# Patient Record
Sex: Female | Born: 1995 | Race: White | Hispanic: No | Marital: Single | Smoking: Former smoker
Health system: Southern US, Community
[De-identification: ages and names within clinical notes are randomized; demographics above are authoritative.]

## PROBLEM LIST (undated history)

## (undated) ENCOUNTER — Inpatient Hospital Stay (HOSPITAL_COMMUNITY): Payer: Self-pay

## (undated) DIAGNOSIS — F17200 Nicotine dependence, unspecified, uncomplicated: Secondary | ICD-10-CM

## (undated) DIAGNOSIS — K08409 Partial loss of teeth, unspecified cause, unspecified class: Secondary | ICD-10-CM

## (undated) DIAGNOSIS — G43909 Migraine, unspecified, not intractable, without status migrainosus: Secondary | ICD-10-CM

## (undated) DIAGNOSIS — N390 Urinary tract infection, site not specified: Secondary | ICD-10-CM

## (undated) DIAGNOSIS — F419 Anxiety disorder, unspecified: Secondary | ICD-10-CM

## (undated) HISTORY — PX: NO PAST SURGERIES: SHX2092

## (undated) HISTORY — PX: WISDOM TOOTH EXTRACTION: SHX21

---
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
## 2017-03-06 ENCOUNTER — Ambulatory Visit (HOSPITAL_COMMUNITY)
Admission: EM | Admit: 2017-03-06 | Discharge: 2017-03-06 | Disposition: A | Discharge status: Home / Self Care | Payer: Self-pay | Attending: Family Medicine | Admitting: Family Medicine

## 2017-03-06 ENCOUNTER — Encounter (HOSPITAL_COMMUNITY): Payer: Self-pay | Admitting: Emergency Medicine

## 2017-03-06 DIAGNOSIS — K2900 Acute gastritis without bleeding: Secondary | ICD-10-CM

## 2017-03-06 DIAGNOSIS — R109 Unspecified abdominal pain: Secondary | ICD-10-CM

## 2017-03-06 DIAGNOSIS — R110 Nausea: Secondary | ICD-10-CM

## 2017-03-06 DIAGNOSIS — Z3202 Encounter for pregnancy test, result negative: Secondary | ICD-10-CM

## 2017-03-06 LAB — POCT URINALYSIS DIP (DEVICE)
BILIRUBIN URINE: NEGATIVE
Bilirubin Urine: NEGATIVE
Glucose, UA: NEGATIVE mg/dL
Glucose, UA: NEGATIVE mg/dL
HGB URINE DIPSTICK: NEGATIVE
HGB URINE DIPSTICK: NEGATIVE
Ketones, ur: NEGATIVE mg/dL
Ketones, ur: NEGATIVE mg/dL
LEUKOCYTES UA: NEGATIVE
NITRITE: NEGATIVE
NITRITE: NEGATIVE
PROTEIN: NEGATIVE mg/dL
PROTEIN: NEGATIVE mg/dL
Specific Gravity, Urine: 1.015 (ref 1.005–1.030)
Specific Gravity, Urine: 1.015 (ref 1.005–1.030)
UROBILINOGEN UA: 0.2 mg/dL (ref 0.0–1.0)
UROBILINOGEN UA: 0.2 mg/dL (ref 0.0–1.0)
pH: 8.5 — ABNORMAL HIGH (ref 5.0–8.0)
pH: >=9 (ref 5.0–8.0)

## 2017-03-06 LAB — POCT PREGNANCY, URINE: Preg Test, Ur: NEGATIVE

## 2017-03-06 MED ORDER — RANITIDINE HCL 150 MG PO TABS: 150.0000 mg | ORAL_TABLET | Freq: Two times a day (BID) | ORAL | 0 refills | Status: DC

## 2017-03-06 NOTE — ED Triage Notes (Signed)
PT reports nausea for a few days. PT reports sharp in in mid abdomen. PT reports she has vomited in the past 24 hours.

## 2017-03-06 NOTE — ED Provider Notes (Signed)
[facility]   [id number] 03/06/17 Arrival Time: 1223   SUBJECTIVE:  Angela Cunningham is a 22 y.o. female who presents to the urgent care with complaint of nausea for a few days. PT reports sharp mid abdomen pain in the lobby.   Starting Friday she lost her appetite and food around her made her nauseated.  She vomited multiple times began about 1:00 this morning.  Sexually active.  LMP:  Early this month  Past Medical History:  Diagnosis Date  . Migraine    No family history on file. Social History   Socioeconomic History  . Marital status: Married    Spouse name: Not on file  . Number of children: Not on file  . Years of education: Not on file  . Highest education level: Not on file  Social Needs  . Financial resource strain: Not on file  . Food insecurity - worry: Not on file  . Food insecurity - inability: Not on file  . Transportation needs - medical: Not on file  . Transportation needs - non-medical: Not on file  Occupational History  . Not on file  Tobacco Use  . Smoking status: Never Smoker  . Smokeless tobacco: Never Used  Substance and Sexual Activity  . Alcohol use: No  . Drug use: No  . Sexual activity: Yes    Birth control/protection: Injection  Other Topics Concern  . Not on file  Social History Narrative  . Not on file   No outpatient medications have been marked as taking for the 03/06/17 encounter [facility] Encounter).   No Known Allergies    ROS: As per HPI, remainder of ROS negative.   OBJECTIVE:   Vitals:   03/06/17 1329 03/06/17 1330  BP: 113/79   Pulse: 67   Resp: 18   Temp: 98.4 F (36.9 C)   TempSrc: Oral   SpO2: 100%   Weight:  130 lb (59 kg)  Height:  5\' 2"  (1.575 m)     General appearance: alert; no distress Eyes: PERRL; EOMI; conjunctiva normal HENT: normocephalic; atraumatic;oral mucosa normal Neck: supple Lungs: clear to auscultation bilaterally Heart: regular rate and rhythm Abdomen: soft,  non-tender; bowel sounds normal; no masses or organomegaly; no guarding or rebound tenderness Back: no CVA tenderness Extremities: no cyanosis or edema; symmetrical with no gross deformities Skin: warm and dry Neurologic: normal gait; grossly normal Psychological: alert and cooperative; normal mood and affect      Labs:  Results for orders placed or performed during the hospital encounter of 03/06/17  POCT urinalysis dip (device)  Result Value Ref Range   Glucose, UA NEGATIVE NEGATIVE mg/dL   Bilirubin Urine NEGATIVE NEGATIVE   Ketones, ur NEGATIVE NEGATIVE mg/dL   Specific Gravity, Urine 1.015 1.005 - 1.030   Hgb urine dipstick NEGATIVE NEGATIVE   pH 8.5 (H) 5.0 - 8.0   Protein, ur NEGATIVE NEGATIVE mg/dL   Urobilinogen, UA 0.2 0.0 - 1.0 mg/dL   Nitrite NEGATIVE NEGATIVE   Leukocytes, UA TRACE (A) NEGATIVE  Pregnancy, urine POC  Result Value Ref Range   Preg Test, Ur NEGATIVE NEGATIVE    Labs Reviewed  POCT URINALYSIS DIP (DEVICE) - Abnormal; Notable for the following components:      Result Value   pH 8.5 (*)    Leukocytes, UA TRACE (*)    All other components within normal limits  POCT PREGNANCY, URINE    No results found.     ASSESSMENT & PLAN:  No diagnosis found.  No orders of the defined types were placed in this encounter.   Reviewed expectations re: course of current medical issues. Questions answered. Outlined signs and symptoms indicating need for more acute intervention. Patient verbalized understanding. After Visit Summary given.    Procedures:      Elvina SidleLauenstein, Eulises Kijowski, MD 03/06/17 1423

## 2017-03-06 NOTE — Discharge Instructions (Signed)
Your tests are negative.  Symptoms are most consistent with gastritis at this point.  Expect resolution in 24 hours.

## 2017-04-04 ENCOUNTER — Encounter (HOSPITAL_COMMUNITY): Payer: Self-pay | Admitting: Family Medicine

## 2017-04-04 DIAGNOSIS — Z5321 Procedure and treatment not carried out due to patient leaving prior to being seen by health care provider: Secondary | ICD-10-CM | POA: Insufficient documentation

## 2017-04-04 DIAGNOSIS — M545 Low back pain: Secondary | ICD-10-CM | POA: Insufficient documentation

## 2017-04-04 LAB — URINALYSIS, ROUTINE W REFLEX MICROSCOPIC
Bilirubin Urine: NEGATIVE
GLUCOSE, UA: NEGATIVE mg/dL
Ketones, ur: 5 mg/dL — AB
NITRITE: NEGATIVE
PH: 5 (ref 5.0–8.0)
PROTEIN: NEGATIVE mg/dL
Specific Gravity, Urine: 1.029 (ref 1.005–1.030)

## 2017-04-04 NOTE — ED Triage Notes (Signed)
Patient reports she experiencing is lower back pain, burning with urination, and increase urgency/frequency These symptoms started about 2 weeks, she took an old antibiotic from a previous infection. Symptoms started back 3 days ago. Also, complains of fever, nasal congestion/drainage, and sore throat. These symptoms have been occurring since Friday.

## 2017-04-04 NOTE — ED Notes (Signed)
There was not enough urine for a POC and UA. Sent down for a UA and could possibly do Beta for pregnancy if pt is not able to urinate again.

## 2017-04-04 NOTE — ED Notes (Signed)
Pt called for VS recheck with no response.  RN notified. 

## 2017-04-05 ENCOUNTER — Emergency Department (HOSPITAL_COMMUNITY)
Admission: EM | Admit: 2017-04-05 | Discharge: 2017-04-05 | Disposition: A | Discharge status: Home / Self Care | Payer: Self-pay | Attending: Emergency Medicine | Admitting: Emergency Medicine

## 2017-04-05 ENCOUNTER — Encounter (HOSPITAL_COMMUNITY): Payer: Self-pay | Admitting: Emergency Medicine

## 2017-04-05 DIAGNOSIS — N10 Acute tubulo-interstitial nephritis: Secondary | ICD-10-CM | POA: Insufficient documentation

## 2017-04-05 DIAGNOSIS — F17200 Nicotine dependence, unspecified, uncomplicated: Secondary | ICD-10-CM | POA: Insufficient documentation

## 2017-04-05 DIAGNOSIS — B349 Viral infection, unspecified: Secondary | ICD-10-CM | POA: Insufficient documentation

## 2017-04-05 HISTORY — DX: Urinary tract infection, site not specified: N39.0

## 2017-04-05 LAB — URINALYSIS, ROUTINE W REFLEX MICROSCOPIC
BILIRUBIN URINE: NEGATIVE
GLUCOSE, UA: NEGATIVE mg/dL
HGB URINE DIPSTICK: NEGATIVE
KETONES UR: 5 mg/dL — AB
NITRITE: NEGATIVE
PH: 5 (ref 5.0–8.0)
Protein, ur: NEGATIVE mg/dL
Specific Gravity, Urine: 1.026 (ref 1.005–1.030)

## 2017-04-05 LAB — RAPID STREP SCREEN (MED CTR MEBANE ONLY): STREPTOCOCCUS, GROUP A SCREEN (DIRECT): NEGATIVE

## 2017-04-05 LAB — POC URINE PREG, ED: Preg Test, Ur: NEGATIVE

## 2017-04-05 MED ORDER — ONDANSETRON 4 MG PO TBDP: 4.0000 mg | ORAL_TABLET | Freq: Three times a day (TID) | ORAL | 0 refills | Status: DC | PRN

## 2017-04-05 MED ORDER — CEPHALEXIN 500 MG PO CAPS
500.0000 mg | ORAL_CAPSULE | Freq: Four times a day (QID) | ORAL | 0 refills | Status: DC
Start: 2017-04-05 — End: 2017-08-20

## 2017-04-05 NOTE — Discharge Instructions (Signed)
Please take the antibiotics as prescribed. Please return to the ER if your symptoms worsen; you have increased pain, fevers, chills, inability to keep any medications down, confusion. Otherwise see the outpatient doctor as requested.

## 2017-04-05 NOTE — ED Provider Notes (Signed)
[facility]-EMERGENCY DEPT Provider Note   CSN: [id number] Arrival date & time: 04/05/17  [id number]     History   Chief Complaint Chief Complaint  Patient presents with  . Nasal Congestion  . Dysuria    HPI Cyndia SkeetersHannah M Broden is a 22 y.o. female.  HPI  22 year old female comes in with chief complaint of pain with urination and right-sided flank pain. Patient has history of recurrent UTI, and reports that her symptoms are consistent with her prior UTIs.  Patient states that her symptoms started 2 weeks ago, she took Azo and leftover antibiotics from prior UTI.  Patient started getting better, but over the past few days her symptoms have returned and now she has flank pain.  Patient has associated subjective fevers and chills.  Patient denies any nausea or emesis.  Additionally, patient is also having congestion, sore throat, and malaise over the past few days.  Patient states that she has had sick contacts, and that her symptoms started about 4 days ago.  Past Medical History:  Diagnosis Date  . Migraine   . UTI (urinary tract infection)     There are no active problems to display for this patient.   History reviewed. No pertinent surgical history.  OB History    No data available       Home Medications    Prior to Admission medications   Medication Sig Start Date End Date Taking? Authorizing Provider  pseudoephedrine-acetaminophen (TYLENOL SINUS) 30-500 MG TABS tablet Take 1 tablet by mouth every 4 (four) hours as needed (congestion).   Yes [provider]  cephALEXin (KEFLEX) 500 MG capsule Take 1 capsule (500 mg total) by mouth 4 (four) times daily. 04/05/17   Derwood KaplanNanavati, Nyomi Howser, MD  ondansetron (ZOFRAN ODT) 4 MG disintegrating tablet Take 1 tablet (4 mg total) by mouth every 8 (eight) hours as needed for nausea or vomiting. 04/05/17   Derwood KaplanNanavati, Alissah Redmon, MD  ranitidine (ZANTAC) 150 MG tablet Take 1 tablet (150 mg total) by mouth 2 (two) times  daily. Patient not taking: Reported on 04/05/2017 03/06/17   Elvina SidleLauenstein, Kurt, MD    Family History History reviewed. No pertinent family history.  Social History Social History   Tobacco Use  . Smoking status: Current Every Day Smoker    Packs/day: 0.50  . Smokeless tobacco: Never Used  Substance Use Topics  . Alcohol use: No  . Drug use: No     Allergies   Patient has no known allergies.   Review of Systems Review of Systems  HENT: Positive for sore throat.   Respiratory: Positive for cough.   Gastrointestinal: Negative for abdominal pain, nausea and vomiting.  Genitourinary: Positive for dysuria and flank pain. Negative for vaginal bleeding and vaginal discharge.  Allergic/Immunologic: Negative for immunocompromised state.  All other systems reviewed and are negative.    Physical Exam Updated Vital Signs BP 126/82   Pulse 85   Temp 97.9 F (36.6 C) (Oral)   Resp 16   LMP 03/16/2017   SpO2 98%   Physical Exam  Constitutional: She is oriented to person, place, and time. She appears well-developed.  HENT:  Head: Normocephalic and atraumatic.  Eyes: EOM are normal.  Neck: Normal range of motion. Neck supple.  Cardiovascular: Normal rate.  Pulmonary/Chest: Effort normal. She has no wheezes.  Abdominal: Bowel sounds are normal. She exhibits no distension. There is no tenderness. There is no guarding.  Genitourinary:  Genitourinary Comments: Right-sided flank tenderness  Lymphadenopathy:  She has cervical adenopathy.  Neurological: She is alert and oriented to person, place, and time.  Skin: Skin is warm and dry.  Nursing note and vitals reviewed.    ED Treatments / Results  Labs (all labs ordered are listed, but only abnormal results are displayed) Labs Reviewed  URINALYSIS, ROUTINE W REFLEX MICROSCOPIC - Abnormal; Notable for the following components:      Result Value   APPearance CLOUDY (*)    Ketones, ur 5 (*)    Leukocytes, UA MODERATE (*)     Bacteria, UA RARE (*)    Squamous Epithelial / LPF 6-30 (*)    All other components within normal limits  RAPID STREP SCREEN (NOT AT [facility])  CULTURE, GROUP A STREP ([facility])  POC URINE PREG, ED    EKG  EKG Interpretation None       Radiology No results found.  Procedures Procedures (including critical care time)  Medications Ordered in ED Medications - No data to display   Initial Impression / Assessment and Plan / ED Course  I have reviewed the triage vital signs and the nursing notes.  Pertinent labs & imaging results that were available during my care of the patient were reviewed by me and considered in my medical decision making (see chart for details).     22 year old with history of recurrent UTIs comes in with chief complaint of dysuria and right-sided flank pain.  Differential diagnosis included kidney stone, appendicitis, however based on symptoms and previous history of recurrent UTI, it appears that most likely she is having pyelonephritis.  Patient was in the ER yesterday and her UA had shown multiple WBCs, and the repeat UA although contaminated shows similar findings.  Differential diagnoses also included pelvic infections, however patient denies any risk factors for, and states that her symptoms are from UTI that she has had in the past, which is a strong odds ratio of confirming UTI.  Additionally, patient is having flulike symptoms.  Patient had a sore throat with a rapid strep that is negative.  We will treat patient as a pyelonephritis. Strict ER return precautions have been discussed, and patient is agreeing with the plan and is comfortable with the workup done and the recommendations from the ER.   Final Clinical Impressions(s) / ED Diagnoses   Final diagnoses:  Acute viral syndrome  Acute pyelonephritis    ED Discharge Orders        Ordered    cephALEXin (KEFLEX) 500 MG capsule  4 times daily     04/05/17 1036    ondansetron (ZOFRAN ODT) 4 MG  disintegrating tablet  Every 8 hours PRN     04/05/17 1036       Derwood Kaplan, MD 04/05/17 1044

## 2017-04-05 NOTE — ED Triage Notes (Signed)
Pt reports sore throat and nasal congestion for the past 3-4 days. No cough. Pt also reports dysuria and R flank pain for the past 2 weeks. Tried Azo, but symptoms got worse.

## 2017-04-07 LAB — CULTURE, GROUP A STREP (THRC)

## 2017-08-20 ENCOUNTER — Encounter (HOSPITAL_COMMUNITY): Payer: Self-pay | Admitting: Emergency Medicine

## 2017-08-20 ENCOUNTER — Ambulatory Visit (HOSPITAL_COMMUNITY)
Admission: EM | Admit: 2017-08-20 | Discharge: 2017-08-20 | Disposition: A | Discharge status: Home / Self Care | Payer: Self-pay | Attending: Internal Medicine | Admitting: Internal Medicine

## 2017-08-20 DIAGNOSIS — Z3201 Encounter for pregnancy test, result positive: Secondary | ICD-10-CM

## 2017-08-20 DIAGNOSIS — R110 Nausea: Secondary | ICD-10-CM

## 2017-08-20 DIAGNOSIS — N925 Other specified irregular menstruation: Secondary | ICD-10-CM

## 2017-08-20 LAB — POCT PREGNANCY, URINE: Preg Test, Ur: POSITIVE — AB

## 2017-08-20 MED ORDER — PRENATAL COMPLETE 14-0.4 MG PO TABS: 1.0000 | ORAL_TABLET | Freq: Every day | ORAL | 0 refills | Status: AC

## 2017-08-20 MED ORDER — DOXYLAMINE-PYRIDOXINE 10-10 MG PO TBEC: 1.0000 | DELAYED_RELEASE_TABLET | Freq: Every evening | ORAL | 0 refills | Status: DC | PRN

## 2017-08-20 NOTE — ED Triage Notes (Signed)
Pt took a pregnancy test and it was positive and she "wants to be sure". Also c/o nausea.

## 2017-08-20 NOTE — ED Provider Notes (Signed)
[facility]    CSN: [id number] Arrival date & time: 08/20/17  1225     History   Chief Complaint Chief Complaint  Patient presents with  . Possible Pregnancy    HPI Angela Cunningham is a 22 y.o. female.   Angela Cunningham presents with requests for confirmation of pregnancy. States she missed her period, her last period was approximately June 2nd or 3rd, but states it was light. Not on birth control. No previous pregnancy. Does not have a PCP or gynecologist. Over the past few days has had nausea and occasional vomiting. No abdominal pain. Does not take any medications regularly. No bleeding. Took a pregnancy test last night and it was positive.     ROS per HPI.      Past Medical History:  Diagnosis Date  . Migraine   . UTI (urinary tract infection)     There are no active problems to display for this patient.   History reviewed. No pertinent surgical history.  OB History   None      Home Medications    Prior to Admission medications   Medication Sig Start Date End Date Taking? Authorizing Provider  Doxylamine-Pyridoxine 10-10 MG TBEC Take 1 tablet by mouth at bedtime as needed (nausea). 08/20/17   Georgetta HaberBurky, Natalie B, NP  Prenatal Vit-Fe Fumarate-FA (PRENATAL COMPLETE) 14-0.4 MG TABS Take 1 tablet by mouth daily. 08/20/17   Georgetta HaberBurky, Natalie B, NP    Family History No family history on file.  Social History Social History   Tobacco Use  . Smoking status: Current Every Day Smoker    Packs/day: 0.50  . Smokeless tobacco: Never Used  Substance Use Topics  . Alcohol use: No  . Drug use: No     Allergies   Patient has no known allergies.   Review of Systems Review of Systems   Physical Exam Triage Vital Signs ED Triage Vitals [08/20/17 1346]  Enc Vitals Group     BP 110/68     Pulse Rate 81     Resp 18     Temp 98.1 F (36.7 C)     Temp src      SpO2 100 %     Weight      Height      Head Circumference      Peak Flow      Pain Score 0       Pain Loc      Pain Edu?      Excl. in GC?    No data found.  Updated Vital Signs BP 110/68   Pulse 81   Temp 98.1 F (36.7 C)   Resp 18   SpO2 100%    Physical Exam  Constitutional: She is oriented to person, place, and time. She appears well-developed and well-nourished. No distress.  Cardiovascular: Normal rate, regular rhythm and normal heart sounds.  Pulmonary/Chest: Effort normal and breath sounds normal.  Abdominal: Soft. There is no tenderness.  Neurological: She is alert and oriented to person, place, and time.  Skin: Skin is warm and dry.     UC Treatments / Results  Labs (all labs ordered are listed, but only abnormal results are displayed) Labs Reviewed  POCT PREGNANCY, URINE - Abnormal; Notable for the following components:      Result Value   Preg Test, Ur POSITIVE (*)    All other components within normal limits    EKG None  Radiology No results found.  Procedures Procedures (including  critical care time)  Medications Ordered in UC Medications - No data to display  Initial Impression / Assessment and Plan / UC Course  I have reviewed the triage vital signs and the nursing notes.  Pertinent labs & imaging results that were available during my care of the patient were reviewed by me and considered in my medical decision making (see chart for details).     Positive urine pregnancy in clinic today. Approximately 73w6days pregnancy based on period 1 month ago. It was light however. Recommended follow up with OB for first appointment and schedule dating [place]. Quit smoking. Daily prenatal. meds safe during pregnancy list provided. Return precautions provided. Patient verbalized understanding and agreeable to plan.    Final Clinical Impressions(s) / UC Diagnoses   Final diagnoses:  Positive pregnancy test     Discharge Instructions     Daily prenatal vitamin.  Tylenol as needed for pain.  Avoid alcohol or smoking.  Please call to establish  with an Ob.  See list of medications safe during pregnancy as needed. Go to [facility] hospital or er if develop pain, bleeding, dehydration or otherwise concerned.    ED Prescriptions    Medication Sig Dispense Auth. Provider   Doxylamine-Pyridoxine 10-10 MG TBEC Take 1 tablet by mouth at bedtime as needed (nausea). 30 tablet Linus Mako B, NP   Prenatal Vit-Fe Fumarate-FA (PRENATAL COMPLETE) 14-0.4 MG TABS Take 1 tablet by mouth daily. 30 each Georgetta Haber, NP     Controlled Substance Prescriptions [place] Controlled Substance Registry consulted? Not Applicable   Georgetta Haber, NP 08/20/17 1502

## 2017-08-20 NOTE — Discharge Instructions (Signed)
Daily prenatal vitamin.  Tylenol as needed for pain.  Avoid alcohol or smoking.  Please call to establish with an Ob.  See list of medications safe during pregnancy as needed. [facility] if develop pain, bleeding, dehydration or otherwise concerned.

## 2017-08-22 ENCOUNTER — Inpatient Hospital Stay (HOSPITAL_COMMUNITY)
Admission: AD | Admit: 2017-08-22 | Discharge: 2017-08-22 | Disposition: A | Discharge status: Home / Self Care | Payer: Medicaid Other | Source: Ambulatory Visit | Attending: Obstetrics & Gynecology | Admitting: Obstetrics & Gynecology

## 2017-08-22 ENCOUNTER — Inpatient Hospital Stay (HOSPITAL_COMMUNITY): Payer: Medicaid Other

## 2017-08-22 ENCOUNTER — Encounter (HOSPITAL_COMMUNITY): Payer: Self-pay

## 2017-08-22 DIAGNOSIS — Z3A01 Less than 8 weeks gestation of pregnancy: Secondary | ICD-10-CM | POA: Insufficient documentation

## 2017-08-22 DIAGNOSIS — R1030 Lower abdominal pain, unspecified: Secondary | ICD-10-CM | POA: Insufficient documentation

## 2017-08-22 DIAGNOSIS — O26891 Other specified pregnancy related conditions, first trimester: Secondary | ICD-10-CM | POA: Diagnosis not present

## 2017-08-22 DIAGNOSIS — F17210 Nicotine dependence, cigarettes, uncomplicated: Secondary | ICD-10-CM | POA: Diagnosis not present

## 2017-08-22 DIAGNOSIS — Z79899 Other long term (current) drug therapy: Secondary | ICD-10-CM | POA: Insufficient documentation

## 2017-08-22 DIAGNOSIS — R109 Unspecified abdominal pain: Secondary | ICD-10-CM

## 2017-08-22 DIAGNOSIS — Z3491 Encounter for supervision of normal pregnancy, unspecified, first trimester: Secondary | ICD-10-CM

## 2017-08-22 DIAGNOSIS — O99331 Smoking (tobacco) complicating pregnancy, first trimester: Secondary | ICD-10-CM | POA: Insufficient documentation

## 2017-08-22 DIAGNOSIS — O219 Vomiting of pregnancy, unspecified: Secondary | ICD-10-CM | POA: Diagnosis not present

## 2017-08-22 LAB — CBC
HEMATOCRIT: 36.8 % (ref 36.0–46.0)
HEMOGLOBIN: 12.5 g/dL (ref 12.0–15.0)
MCH: 31.3 pg (ref 26.0–34.0)
MCHC: 34 g/dL (ref 30.0–36.0)
MCV: 92.2 fL (ref 78.0–100.0)
Platelets: 204 10*3/uL (ref 150–400)
RBC: 3.99 MIL/uL (ref 3.87–5.11)
RDW: 13.5 % (ref 11.5–15.5)
WBC: 9.5 10*3/uL (ref 4.0–10.5)

## 2017-08-22 LAB — GC/CHLAMYDIA PROBE AMP (~~LOC~~) NOT AT ARMC
Chlamydia: NEGATIVE
Neisseria Gonorrhea: NEGATIVE

## 2017-08-22 LAB — URINALYSIS, ROUTINE W REFLEX MICROSCOPIC
Bilirubin Urine: NEGATIVE
GLUCOSE, UA: NEGATIVE mg/dL
HGB URINE DIPSTICK: NEGATIVE
Ketones, ur: NEGATIVE mg/dL
LEUKOCYTES UA: NEGATIVE
Nitrite: NEGATIVE
Protein, ur: NEGATIVE mg/dL
Specific Gravity, Urine: 1.02 (ref 1.005–1.030)
pH: 6 (ref 5.0–8.0)

## 2017-08-22 LAB — WET PREP, GENITAL
Sperm: NONE SEEN
Trich, Wet Prep: NONE SEEN
Yeast Wet Prep HPF POC: NONE SEEN

## 2017-08-22 LAB — ABO/RH: ABO/RH(D): O NEG

## 2017-08-22 LAB — HIV ANTIBODY (ROUTINE TESTING W REFLEX): HIV SCREEN 4TH GENERATION: NONREACTIVE

## 2017-08-22 LAB — HCG, QUANTITATIVE, PREGNANCY: hCG, Beta Chain, Quant, S: 18499 m[IU]/mL — ABNORMAL HIGH (ref <5)

## 2017-08-22 MED ORDER — PROMETHAZINE HCL 25 MG PO TABS: 12.5000 mg | ORAL_TABLET | Freq: Four times a day (QID) | ORAL | 2 refills | Status: DC | PRN

## 2017-08-22 NOTE — MAU Note (Addendum)
Starting having sharp, shooting  abdominal pain earlier today.  Rating pain 6/10. Denies LOF, bleeding, nausea or vomiting.

## 2017-08-22 NOTE — MAU Provider Note (Addendum)
Chief Complaint: Abdominal Pain   First Provider Initiated Contact with Patient 08/22/17 0411      SUBJECTIVE HPI: Angela Cunningham is a 22 y.o. G1P0 at [redacted]w[redacted]d by LMP who presents to maternity admissions reporting intermittent sharp lower abdominal pain starting today. The pain comes and goes and nothing makes it better or worse.  It does not radiate.  There are no other associated symptoms.  She has not tried any treatments.  She denies vaginal bleeding, vaginal itching/burning, urinary symptoms, h/a, dizziness, n/v, or fever/chills.     HPI  Past Medical History:  Diagnosis Date  . Migraine   . UTI (urinary tract infection)    Past Surgical History:  Procedure Laterality Date  . WISDOM TOOTH EXTRACTION     Social History   Socioeconomic History  . Marital status: Married    Spouse name: Not on file  . Number of children: Not on file  . Years of education: Not on file  . Highest education level: Not on file  Occupational History  . Not on file  Social Needs  . Financial resource strain: Not on file  . Food insecurity:    Worry: Not on file    Inability: Not on file  . Transportation needs:    Medical: Not on file    Non-medical: Not on file  Tobacco Use  . Smoking status: Current Every Day Smoker    Packs/day: 0.50  . Smokeless tobacco: Never Used  Substance and Sexual Activity  . Alcohol use: No  . Drug use: No  . Sexual activity: Yes    Birth control/protection: Injection  Lifestyle  . Physical activity:    Days per week: Not on file    Minutes per session: Not on file  . Stress: Not on file  Relationships  . Social connections:    Talks on phone: Not on file    Gets together: Not on file    Attends religious service: Not on file    Active member of club or organization: Not on file    Attends meetings of clubs or organizations: Not on file    Relationship status: Not on file  . Intimate partner violence:    Fear of current or ex partner: Not on file     Emotionally abused: Not on file    Physically abused: Not on file    Forced sexual activity: Not on file  Other Topics Concern  . Not on file  Social History Narrative  . Not on file   No current facility-administered medications on file prior to encounter.    Current Outpatient Medications on File Prior to Encounter  Medication Sig Dispense Refill  . Prenatal Vit-Fe Fumarate-FA (PRENATAL COMPLETE) 14-0.4 MG TABS Take 1 tablet by mouth daily. 30 each 0  . Doxylamine-Pyridoxine 10-10 MG TBEC Take 1 tablet by mouth at bedtime as needed (nausea). 30 tablet 0   No Known Allergies  ROS:  Review of Systems  Constitutional: Negative for chills, fatigue and fever.  Respiratory: Negative for shortness of breath.   Cardiovascular: Negative for chest pain.  Gastrointestinal: Positive for abdominal pain. Negative for nausea and vomiting.  Genitourinary: Positive for pelvic pain. Negative for difficulty urinating, dysuria, flank pain, vaginal bleeding, vaginal discharge and vaginal pain.  Neurological: Negative for dizziness and headaches.  Psychiatric/Behavioral: Negative.      I have reviewed patient's Past Medical Hx, Surgical Hx, Family Hx, Social Hx, medications and allergies.   Physical Exam   Patient  Vitals for the past 24 hrs:  BP Temp Temp src Pulse Resp SpO2 Height Weight  08/22/17 0015 112/64 98.1 F (36.7 C) Oral 89 18 100 % 5\' 2"  (1.575 m) 155 lb (70.3 kg)   Constitutional: Well-developed, well-nourished female in no acute distress.  Cardiovascular: normal rate Respiratory: normal effort GI: Abd soft, non-tender. Pos BS x 4 MS: Extremities nontender, no edema, normal ROM Neurologic: Alert and oriented x 4.  GU: Neg CVAT.  PELVIC EXAM: Cervix pink, visually closed, without lesion, scant white creamy discharge, vaginal walls and external genitalia normal Bimanual exam: Cervix 0/long/high, firm, anterior, neg CMT, uterus nontender, nonenlarged, adnexa without tenderness,  enlargement, or mass   LAB RESULTS Results for orders placed or performed during the hospital encounter of 08/22/17 (from the past 24 hour(s))  Urinalysis, Routine w reflex microscopic     Status: None   Collection Time: 08/22/17 12:20 AM  Result Value Ref Range   Color, Urine YELLOW YELLOW   APPearance CLEAR CLEAR   Specific Gravity, Urine 1.020 1.005 - 1.030   pH 6.0 5.0 - 8.0   Glucose, UA NEGATIVE NEGATIVE mg/dL   Hgb urine dipstick NEGATIVE NEGATIVE   Bilirubin Urine NEGATIVE NEGATIVE   Ketones, ur NEGATIVE NEGATIVE mg/dL   Protein, ur NEGATIVE NEGATIVE mg/dL   Nitrite NEGATIVE NEGATIVE   Leukocytes, UA NEGATIVE NEGATIVE  CBC     Status: None   Collection Time: 08/22/17  1:20 AM  Result Value Ref Range   WBC 9.5 4.0 - 10.5 K/uL   RBC 3.99 3.87 - 5.11 MIL/uL   Hemoglobin 12.5 12.0 - 15.0 g/dL   HCT 16.1 09.6 - 04.5 %   MCV 92.2 78.0 - 100.0 fL   MCH 31.3 26.0 - 34.0 pg   MCHC 34.0 30.0 - 36.0 g/dL   RDW 40.9 81.1 - 91.4 %   Platelets 204 150 - 400 K/uL  hCG, quantitative, pregnancy     Status: Abnormal   Collection Time: 08/22/17  1:20 AM  Result Value Ref Range   hCG, Beta Chain, Quant, S 18,499 (H) <5 mIU/mL  ABO/Rh     Status: None   Collection Time: 08/22/17  1:20 AM  Result Value Ref Range   ABO/RH(D)      O NEG Performed at [facility], [address], [place], [place] [address]   Wet prep, genital     Status: Abnormal   Collection Time: 08/22/17  4:09 AM  Result Value Ref Range   Yeast Wet Prep HPF POC NONE SEEN NONE SEEN   Trich, Wet Prep NONE SEEN NONE SEEN   Clue Cells Wet Prep HPF POC PRESENT (A) NONE SEEN   WBC, Wet Prep HPF POC FEW (A) NONE SEEN   Sperm NONE SEEN     --/--/O NEG Performed at [facility], [address], [place], [place] [address]  [phone])  IMAGING US Ob Comp Less 14 Wks  Result Date: 08/22/2017 CLINICAL DATA:  22 year old pregnant female with abdominal pain. LMP: 07/10/2017 corresponding to an estimated  gestational age of [redacted] weeks, 1 day. EXAM: OBSTETRIC <14 WK [place] AND TRANSVAGINAL OB US TECHNIQUE: Both transabdominal and transvaginal ultrasound examinations were performed for complete evaluation of the gestation as well as the maternal uterus, adnexal regions, and pelvic cul-de-sac. Transvaginal technique was performed to assess early pregnancy. COMPARISON:  None. FINDINGS: Intrauterine gestational sac: None Yolk sac:  Visualized. Embryo:  Visualized. Cardiac Activity: Visualized. Heart Rate: 84 bpm CRL:  2 mm  5 w   5 d                  US EDC: 04/19/2018 Subchorionic hemorrhage:  None visualized. Maternal uterus/adnexae: The maternal ovaries appear unremarkable. The right ovary measures 3.5 x 3.2 x 3.1 cm. There is a corpus luteum cyst in the right ovary. The left ovary measures 2.8 x 1.2 x 1.8 cm. IMPRESSION: 1. Single live intrauterine pregnancy with an estimated gestational age of [redacted] weeks, 5 days. 2. Fetal pole bradycardia. Clinical correlation and close follow-up recommended. Electronically Signed   By: Elgie CollardArash  Radparvar M.D.   On: 08/22/2017 03:17   [place] Ob Transvaginal  Result Date: 08/22/2017 CLINICAL DATA:  22 year old pregnant female with abdominal pain. LMP: 07/10/2017 corresponding to an estimated gestational age of [redacted] weeks, 1 day. EXAM: OBSTETRIC <14 WK US AND TRANSVAGINAL OB US TECHNIQUE: Both transabdominal and transvaginal ultrasound examinations were performed for complete evaluation of the gestation as well as the maternal uterus, adnexal regions, and pelvic cul-de-sac. Transvaginal technique was performed to assess early pregnancy. COMPARISON:  None. FINDINGS: Intrauterine gestational sac: None Yolk sac:  Visualized. Embryo:  Visualized. Cardiac Activity: Visualized. Heart Rate: 84 bpm CRL:  2 mm   5 w   5 d                  US EDC: 04/19/2018 Subchorionic hemorrhage:  None visualized. Maternal uterus/adnexae: The maternal ovaries appear unremarkable. The right ovary measures 3.5 x 3.2 x 3.1  cm. There is a corpus luteum cyst in the right ovary. The left ovary measures 2.8 x 1.2 x 1.8 cm. IMPRESSION: 1. Single live intrauterine pregnancy with an estimated gestational age of [redacted] weeks, 5 days. 2. Fetal pole bradycardia. Clinical correlation and close follow-up recommended. Electronically Signed   By: Elgie CollardArash  Radparvar M.D.   On: 08/22/2017 03:17    MAU Management/MDM: UA, CBC, quant hcg, HIV, RPR, wet prep, GCC, OB US US confirms IUP with low FHR of 84 but may be appropriate for early gestation Wet prep with clue cell but no other symptoms, does not meet clinical criteria for BV Pt unable to pick up Diclegis prescription with Medicaid pending so Rx for Phenergan 12.5-25 mg Q 6 hours PRN Reviewed US results with pt Outpatient US ordered in 1 week for viability Pt discharged with strict return precautions.  ASSESSMENT 1. Nausea and vomiting during pregnancy prior to [redacted] weeks gestation   2. Abdominal pain during pregnancy, first trimester   3. Normal IUP (intrauterine pregnancy) on prenatal ultrasound, first trimester     PLAN Discharge home Allergies as of 08/22/2017   No Known Allergies     Medication List    TAKE these medications   Doxylamine-Pyridoxine 10-10 MG Tbec Take 1 tablet by mouth at bedtime as needed (nausea).   PRENATAL COMPLETE 14-0.4 MG Tabs Take 1 tablet by mouth daily.   promethazine 25 MG tablet Commonly known as:  PHENERGAN Take 0.5-1 tablets (12.5-25 mg total) by mouth every 6 (six) hours as needed for nausea.      Follow-up Information    Prenatal care with provider of your choice Follow up.   Why:  See list provided. Return to MAU as needed for emergencies.          Sharen CounterLisa Leftwich-Kirby Certified Nurse-Midwife 08/22/2017  5:19 AM

## 2017-08-22 NOTE — Discharge Instructions (Signed)
[facility]  ° ° °[facility]       Phone: [phone] ° °[facility] Phone: [phone] ° °[facility]  Phone: [phone] ° °[facility]  Phone: [phone] ° °[facility]  Phone: [phone] ° °[facility]       Phone: [phone] ° °[facility]    Phone: [phone]  ° °[facility]    Phone: [phone] ° °[facility]    Phone: [phone] ° °[facility]    Phone: [phone] ° °[facility]    Phone: [phone] ° °[facility]       Phone: [phone]  ° °[facility]  Phone: [phone] ° °[facility]    Phone: [phone] ° °[facility]   Phone: [phone] ° °[facility]      Phone: [phone] ° °[facility]    Phone: [phone] ° °

## 2017-08-31 ENCOUNTER — Ambulatory Visit (HOSPITAL_COMMUNITY)
Admission: RE | Admit: 2017-08-31 | Discharge: 2017-08-31 | Disposition: A | Discharge status: Home / Self Care | Payer: Self-pay | Source: Ambulatory Visit | Attending: Advanced Practice Midwife | Admitting: Advanced Practice Midwife

## 2017-08-31 ENCOUNTER — Other Ambulatory Visit (HOSPITAL_COMMUNITY): Payer: Self-pay | Admitting: Advanced Practice Midwife

## 2017-08-31 ENCOUNTER — Encounter: Payer: Self-pay | Admitting: Obstetrics and Gynecology

## 2017-08-31 ENCOUNTER — Ambulatory Visit (INDEPENDENT_AMBULATORY_CARE_PROVIDER_SITE_OTHER): Payer: Self-pay | Admitting: General Practice

## 2017-08-31 DIAGNOSIS — Z712 Person consulting for explanation of examination or test findings: Secondary | ICD-10-CM

## 2017-08-31 DIAGNOSIS — R109 Unspecified abdominal pain: Secondary | ICD-10-CM | POA: Insufficient documentation

## 2017-08-31 DIAGNOSIS — O26891 Other specified pregnancy related conditions, first trimester: Secondary | ICD-10-CM

## 2017-08-31 DIAGNOSIS — Z3A01 Less than 8 weeks gestation of pregnancy: Secondary | ICD-10-CM | POA: Insufficient documentation

## 2017-08-31 NOTE — Progress Notes (Signed)
Patient presents to office today for viability ultrasound results. Reviewed results with Judeth Hornrin Lawrence who finds living IUP, patient should begin prenatal care.  Informed patient of results, provided pictures, & reviewed dating. Patient verbalized understanding to all & [facility].

## 2017-08-31 NOTE — Progress Notes (Signed)
Chart reviewed for nurse visit. Agree with plan of care.   Judeth HornLawrence, Eaven Schwager, NP 08/31/2017 12:06 PM

## 2017-11-04 ENCOUNTER — Encounter: Payer: Self-pay | Admitting: Neurology

## 2017-11-04 ENCOUNTER — Ambulatory Visit: Payer: Medicaid Other | Admitting: Neurology

## 2017-11-04 DIAGNOSIS — G43019 Migraine without aura, intractable, without status migrainosus: Secondary | ICD-10-CM | POA: Diagnosis not present

## 2017-11-04 MED ORDER — AMITRIPTYLINE HCL 10 MG PO TABS: ORAL_TABLET | ORAL | 3 refills | Status: DC

## 2017-11-04 MED ORDER — HYDROCODONE-ACETAMINOPHEN 5-325 MG PO TABS: 1.0000 | ORAL_TABLET | Freq: Four times a day (QID) | ORAL | 0 refills | Status: DC | PRN

## 2017-11-04 NOTE — Patient Instructions (Signed)
We will start amitriptyline for the headache, call for any dose adjustments.   Elavil (amitriptyline) is an antidepressant medication that has many uses that may include headache, whiplash injuries, or for peripheral neuropathy pain. Side effects may include drowsiness, dry mouth, blurred vision, or constipation. As with any antidepressant medication, worsening depression may occur. If you had any significant side effects, please call our office. The full effects of this medication may take 7-10 days after starting the drug, or going up on the dose.

## 2017-11-04 NOTE — Progress Notes (Signed)
Reason for visit: Migraine headache  Referring physician: Dr. Rise Angela Cunningham is a 22 y.o. female  History of present illness:  Angela Cunningham is a 22 year old right-handed white female with a history of current pregnancy.  Her due date is on 16 April 2018.  The patient has a lifelong history of migraine headaches, she has had headaches since she was 49 or 22 years old.  The patient indicates that both parents and her brother also have migraine.  The patient has been having almost daily headaches for a number of years, she may go only 7 days a month without headache.  Headaches have increased in frequency since she has been pregnant, she may only have 3 or 4 days without headache a month.  The headaches are mainly in the temporal regions bilaterally associated with photophobia and phonophobia, the patient does have some nausea at times, but this is not a significant issue now.  She reports no numbness or weakness of the face, arms, legs.  She may have some blurring of vision, she reports no lights or zigzag lines in the vision.  The patient currently is taking Tylenol when she gets a headache but this does not help much.  She has never been on a daily medication to prevent the headache.  In the past she has taken Vicodin if needed.  The patient denies any particular activating factors for her headache.  She comes to this office for an evaluation.   Past Medical History:  Diagnosis Date  . Migraine   . UTI (urinary tract infection)     Past Surgical History:  Procedure Laterality Date  . WISDOM TOOTH EXTRACTION      Family History  Problem Relation Age of Onset  . Migraines Mother   . Migraines Father   . Migraines Brother     Social history:  reports that she has been smoking. She has been smoking about 0.25 packs per day. She has never used smokeless tobacco. She reports that she does not drink alcohol or use drugs.  Medications:  Prior to Admission medications     Medication Sig Start Date End Date Taking? Authorizing Provider  acetaminophen (TYLENOL) 325 MG tablet Take 650 mg by mouth every 6 (six) hours as needed.   Yes [provider]  Prenatal Vit-Fe Fumarate-FA (PRENATAL COMPLETE) 14-0.4 MG TABS Take 1 tablet by mouth daily. 08/20/17  Yes Georgetta Haber, NP  amitriptyline (ELAVIL) 10 MG tablet Take one tablet at night for one week, then take 2 tablets at night for one week, then take 3 tablets at night. 11/04/17   York Spaniel, MD  HYDROcodone-acetaminophen (NORCO/VICODIN) 5-325 MG tablet Take 1 tablet by mouth every 6 (six) hours as needed for moderate pain. 11/04/17   York Spaniel, MD     No Known Allergies  ROS:  Out of a complete 14 system review of symptoms, the patient complains only of the following symptoms, and all other reviewed systems are negative.  Fatigue Blurred vision Incontinence of the bladder Easy bruising Headache  Blood pressure 116/64, pulse 85, height 5\' 2"  (1.575 m), weight 162 lb (73.5 kg), last menstrual period 07/10/2017.  Physical Exam  General: The patient is alert and cooperative at the time of the examination.  Eyes: Pupils are equal, round, and reactive to light. Discs are flat bilaterally.  Neck: The neck is supple, no carotid bruits are noted.  Respiratory: The respiratory examination is clear.  Cardiovascular: The cardiovascular  examination reveals a regular rate and rhythm, no obvious murmurs or rubs are noted.  Skin: Extremities are without significant edema.  Neurologic Exam  Mental status: The patient is alert and oriented x 3 at the time of the examination. The patient has apparent normal recent and remote memory, with an apparently normal attention span and concentration ability.  Cranial nerves: Facial symmetry is present. There is good sensation of the face to pinprick and soft touch bilaterally. The strength of the facial muscles and the muscles to head turning and  shoulder shrug are normal bilaterally. Speech is well enunciated, no aphasia or dysarthria is noted. Extraocular movements are full. Visual fields are full. The tongue is midline, and the patient has symmetric elevation of the soft palate. No obvious hearing deficits are noted.  Motor: The motor testing reveals 5 over 5 strength of all 4 extremities. Good symmetric motor tone is noted throughout.  Sensory: Sensory testing is intact to pinprick, soft touch, vibration sensation, and position sense on all 4 extremities. No evidence of extinction is noted.  Coordination: Cerebellar testing reveals good finger-nose-finger and heel-to-shin bilaterally.  Gait and station: Gait is normal. Tandem gait is normal. Romberg is negative. No drift is seen.  Reflexes: Deep tendon reflexes are symmetric and normal bilaterally. Toes are downgoing bilaterally.   Assessment/Plan:  1.  Intractable migraine headache  2.  Current pregnancy  The medications we can use for the migraine are somewhat limited.  The patient will be started on amitriptyline, working up on the dose to 30 mg at night.  She will call for any dose adjustments.  The patient will be given Vicodin if needed for the headache, she can take Tylenol if needed.  The patient claims that she is incapacitated with a headache frequently.  She will follow-up in 3 months.  Marlan Palau MD 11/04/2017 10:56 AM  [facility] [address] [place], [place] [address]  Phone [phone] Fax [phone]

## 2017-11-13 ENCOUNTER — Encounter (HOSPITAL_COMMUNITY): Payer: Self-pay

## 2017-11-13 ENCOUNTER — Inpatient Hospital Stay (HOSPITAL_COMMUNITY)
Admission: AD | Admit: 2017-11-13 | Discharge: 2017-11-13 | Disposition: A | Discharge status: Home / Self Care | Payer: Medicaid Other | Source: Ambulatory Visit | Attending: Obstetrics and Gynecology | Admitting: Obstetrics and Gynecology

## 2017-11-13 DIAGNOSIS — B373 Candidiasis of vulva and vagina: Secondary | ICD-10-CM

## 2017-11-13 DIAGNOSIS — O26892 Other specified pregnancy related conditions, second trimester: Secondary | ICD-10-CM | POA: Diagnosis present

## 2017-11-13 DIAGNOSIS — O98812 Other maternal infectious and parasitic diseases complicating pregnancy, second trimester: Secondary | ICD-10-CM | POA: Diagnosis not present

## 2017-11-13 DIAGNOSIS — O99332 Smoking (tobacco) complicating pregnancy, second trimester: Secondary | ICD-10-CM | POA: Diagnosis not present

## 2017-11-13 DIAGNOSIS — Z3A18 18 weeks gestation of pregnancy: Secondary | ICD-10-CM | POA: Diagnosis not present

## 2017-11-13 DIAGNOSIS — N898 Other specified noninflammatory disorders of vagina: Secondary | ICD-10-CM | POA: Diagnosis present

## 2017-11-13 DIAGNOSIS — B3731 Acute candidiasis of vulva and vagina: Secondary | ICD-10-CM

## 2017-11-13 LAB — URINALYSIS, ROUTINE W REFLEX MICROSCOPIC
BILIRUBIN URINE: NEGATIVE
GLUCOSE, UA: NEGATIVE mg/dL
HGB URINE DIPSTICK: NEGATIVE
Ketones, ur: NEGATIVE mg/dL
NITRITE: NEGATIVE
PROTEIN: NEGATIVE mg/dL
Specific Gravity, Urine: 1.006 (ref 1.005–1.030)
WBC, UA: >50 WBC/hpf — ABNORMAL HIGH (ref 0–5)
pH: 8 (ref 5.0–8.0)

## 2017-11-13 LAB — WET PREP, GENITAL
Sperm: NONE SEEN
Trich, Wet Prep: NONE SEEN

## 2017-11-13 MED ORDER — TERCONAZOLE 0.4 % VA CREA: 1.0000 | TOPICAL_CREAM | Freq: Every day | VAGINAL | 0 refills | Status: AC

## 2017-11-13 NOTE — Discharge Instructions (Signed)

## 2017-11-13 NOTE — MAU Note (Signed)
Last night noticed some green/yellow discharge  Saw some this AM and it is gone now  No pain, no bleeding

## 2017-11-13 NOTE — MAU Provider Note (Signed)
History     CSN: [id number]  Arrival date and time: 11/13/17 4098   First Provider Initiated Contact with Patient 11/13/17 1916      Chief Complaint  Patient presents with  . Vaginal Discharge   Angela Cunningham is a 22 y.o. G1P0 at [redacted]w[redacted]d who presents today with vaginal discharge. She states that her last OB she was told it could be yeast, but the culture was negative. However, in the last day she has had increased discharge. She denies any complications with this pregnancy at this time.  Next OB 11/25/17  Vaginal Discharge  The patient's primary symptoms include vaginal discharge. The patient's pertinent negatives include no pelvic pain. This is a new problem. The current episode started yesterday. The problem occurs constantly. The problem has been unchanged. The patient is experiencing no pain. She is pregnant. Pertinent negatives include no chills, dysuria, fever, frequency, nausea or vomiting. The vaginal discharge was green and yellow. There has been no bleeding. Nothing aggravates the symptoms. She has tried nothing for the symptoms. Sexual activity: patient has had intercourse in the last 24 hours.  Menstrual history: LMP 07/10/17     OB History    Gravida  1   Para      Term      Preterm      AB      Living        SAB      TAB      Ectopic      Multiple      Live Births              Past Medical History:  Diagnosis Date  . Migraine   . UTI (urinary tract infection)     Past Surgical History:  Procedure Laterality Date  . WISDOM TOOTH EXTRACTION      Family History  Problem Relation Age of Onset  . Migraines Mother   . Migraines Father   . Migraines Brother     Social History   Tobacco Use  . Smoking status: Current Every Day Smoker    Packs/day: 0.25  . Smokeless tobacco: Never Used  Substance Use Topics  . Alcohol use: No  . Drug use: No    Allergies: No Known Allergies  Medications Prior to Admission  Medication Sig Dispense  Refill Last Dose  . acetaminophen (TYLENOL) 325 MG tablet Take 650 mg by mouth every 6 (six) hours as needed.   11/12/2017 at Unknown time  . Prenatal Vit-Fe Fumarate-FA (PRENATAL COMPLETE) 14-0.4 MG TABS Take 1 tablet by mouth daily. 30 each 0 11/13/2017 at Unknown time  . amitriptyline (ELAVIL) 10 MG tablet Take one tablet at night for one week, then take 2 tablets at night for one week, then take 3 tablets at night. 90 tablet 3   . HYDROcodone-acetaminophen (NORCO/VICODIN) 5-325 MG tablet Take 1 tablet by mouth every 6 (six) hours as needed for moderate pain. 30 tablet 0     Review of Systems  Constitutional: Negative for chills and fever.  Gastrointestinal: Negative for nausea and vomiting.  Genitourinary: Positive for vaginal discharge. Negative for decreased urine volume, dysuria, frequency, pelvic pain and vaginal bleeding.   Physical Exam   Blood pressure 119/70, pulse (!) 107, temperature 98.3 F (36.8 C), temperature source Oral, resp. rate 18, weight 74.4 kg, last menstrual period 07/10/2017.  Physical Exam  Nursing note and vitals reviewed. Constitutional: She is oriented to person, place, and time. She appears well-developed and well-nourished.  No distress.  HENT:  Head: Normocephalic.  Cardiovascular: Normal rate.  Respiratory: Effort normal.  GI: Soft. There is no tenderness. There is no rebound.  Genitourinary:  Genitourinary Comments:  External: no lesion Vagina: moderate amount of clumpy white discharge  Cervix: pink, smooth, closed/thick  Uterus: AGA, FHT 163 with doppler   Neurological: She is alert and oriented to person, place, and time.  Skin: Skin is warm and dry.  Psychiatric: She has a normal mood and affect.   Results for orders placed or performed during the hospital encounter of 11/13/17 (from the past 24 hour(s))  Urinalysis, Routine w reflex microscopic     Status: Abnormal   Collection Time: 11/13/17  6:49 PM  Result Value Ref Range   Color, Urine  YELLOW YELLOW   APPearance HAZY (A) CLEAR   Specific Gravity, Urine 1.006 1.005 - 1.030   pH 8.0 5.0 - 8.0   Glucose, UA NEGATIVE NEGATIVE mg/dL   Hgb urine dipstick NEGATIVE NEGATIVE   Bilirubin Urine NEGATIVE NEGATIVE   Ketones, ur NEGATIVE NEGATIVE mg/dL   Protein, ur NEGATIVE NEGATIVE mg/dL   Nitrite NEGATIVE NEGATIVE   Leukocytes, UA LARGE (A) NEGATIVE   RBC / HPF 0-5 0 - 5 RBC/hpf   WBC, UA >50 (H) 0 - 5 WBC/hpf   Bacteria, UA RARE (A) NONE SEEN   Squamous Epithelial / LPF 11-20 0 - 5   Mucus PRESENT   Wet prep, genital     Status: Abnormal   Collection Time: 11/13/17  7:24 PM  Result Value Ref Range   Yeast Wet Prep HPF POC PRESENT (A) NONE SEEN   Trich, Wet Prep NONE SEEN NONE SEEN   Clue Cells Wet Prep HPF POC PRESENT (A) NONE SEEN   WBC, Wet Prep HPF POC MODERATE (A) NONE SEEN   Sperm NONE SEEN      MAU Course  Procedures  MDM   Assessment and Plan   1. Yeast infection involving the vagina and surrounding area   2. [redacted] weeks gestation of pregnancy    DC home Urine culture pending  Comfort measures reviewed  2nd/3rd Trimester precautions  Bleeding precautions RX: terazol 7 as directed  Return to MAU as needed FU with OB as planned  Follow-up Information    Ob/Gyn, [facility] Follow up.   Contact information: [address] [place] [address] [phone]              Thressa Sheller 11/13/2017, 7:18 PM

## 2017-11-14 LAB — GC/CHLAMYDIA PROBE AMP (~~LOC~~) NOT AT ARMC
CHLAMYDIA, DNA PROBE: NEGATIVE
Neisseria Gonorrhea: NEGATIVE

## 2017-11-15 LAB — CULTURE, OB URINE

## 2018-02-08 NOTE — L&D Delivery Note (Signed)
Pt was admitted with SROM. She was admitted and started on pit aug. She progressed along a nl labor curve. She had an epidural. She pushed for 30 min and had a SVD of one live viable female infant over a second degree midline tear in the ROA position. Placenta-S/I. Tear closed with 3-0 chromic. Baby to NBN. EBL-400cc

## 2018-03-13 NOTE — Progress Notes (Deleted)
[facility]  PATIENT: Angela Cunningham DOB: Mar 01, 1995   REASON FOR VISIT: *** HISTORY FROM:    HISTORY OF PRESENT ILLNESS: 11/04/17 KWMs. Tilmon is a 23 year old right-handed white female with a history of current pregnancy.  Her due date is on 16 April 2018.  The patient has a lifelong history of migraine headaches, she has had headaches since she was 64 or 23 years old.  The patient indicates that both parents and her brother also have migraine.  The patient has been having almost daily headaches for a number of years, she may go only 7 days a month without headache.  Headaches have increased in frequency since she has been pregnant, she may only have 3 or 4 days without headache a month.  The headaches are mainly in the temporal regions bilaterally associated with photophobia and phonophobia, the patient does have some nausea at times, but this is not a significant issue now.  She reports no numbness or weakness of the face, arms, legs.  She may have some blurring of vision, she reports no lights or zigzag lines in the vision.  The patient currently is taking Tylenol when she gets a headache but this does not help much.  She has never been on a daily medication to prevent the headache.  In the past she has taken Vicodin if needed.  The patient denies any particular activating factors for her headache.  She comes to this office for an evaluation.   REVIEW OF SYSTEMS: Full 14 system review of systems performed and notable only for those listed, all others are neg:  Constitutional: neg  Cardiovascular: neg Ear/Nose/Throat: neg  Skin: neg Eyes: neg Respiratory: neg Gastroitestinal: neg  Hematology/Lymphatic: neg  Endocrine: neg Musculoskeletal:neg Allergy/Immunology: neg Neurological: neg Psychiatric: neg Sleep : neg   ALLERGIES: No Known Allergies  HOME MEDICATIONS: Outpatient Medications Prior to Visit  Medication Sig Dispense Refill  . acetaminophen (TYLENOL)  325 MG tablet Take 650 mg by mouth every 6 (six) hours as needed.    Marland Kitchen amitriptyline (ELAVIL) 10 MG tablet Take one tablet at night for one week, then take 2 tablets at night for one week, then take 3 tablets at night. 90 tablet 3  . HYDROcodone-acetaminophen (NORCO/VICODIN) 5-325 MG tablet Take 1 tablet by mouth every 6 (six) hours as needed for moderate pain. 30 tablet 0  . Prenatal Vit-Fe Fumarate-FA (PRENATAL COMPLETE) 14-0.4 MG TABS Take 1 tablet by mouth daily. 30 each 0   No facility-administered medications prior to visit.     PAST MEDICAL HISTORY: Past Medical History:  Diagnosis Date  . Migraine   . UTI (urinary tract infection)     PAST SURGICAL HISTORY: Past Surgical History:  Procedure Laterality Date  . WISDOM TOOTH EXTRACTION      FAMILY HISTORY: Family History  Problem Relation Age of Onset  . Migraines Mother   . Migraines Father   . Migraines Brother     SOCIAL HISTORY: Social History   Socioeconomic History  . Marital status: Single    Spouse name: Not on file  . Number of children: Not on file  . Years of education: Not on file  . Highest education level: Not on file  Occupational History  . Not on file  Social Needs  . Financial resource strain: Not on file  . Food insecurity:    Worry: Not on file    Inability: Not on file  . Transportation needs:    Medical: Not on  file    Non-medical: Not on file  Tobacco Use  . Smoking status: Current Every Day Smoker    Packs/day: 0.25  . Smokeless tobacco: Never Used  Substance and Sexual Activity  . Alcohol use: No  . Drug use: No  . Sexual activity: Yes  Lifestyle  . Physical activity:    Days per week: Not on file    Minutes per session: Not on file  . Stress: Not on file  Relationships  . Social connections:    Talks on phone: Not on file    Gets together: Not on file    Attends religious service: Not on file    Active member of club or organization: Not on file    Attends meetings of  clubs or organizations: Not on file    Relationship status: Not on file  . Intimate partner violence:    Fear of current or ex partner: Not on file    Emotionally abused: Not on file    Physically abused: Not on file    Forced sexual activity: Not on file  Other Topics Concern  . Not on file  Social History Narrative  . Not on file     PHYSICAL EXAM  There were no vitals filed for this visit. There is no height or weight on file to calculate BMI.  Generalized: Well developed, in no acute distress  Head: normocephalic and atraumatic,. Oropharynx benign  Neck: Supple, no carotid bruits  Cardiac: Regular rate rhythm, no murmur  Musculoskeletal: No deformity   Neurological examination   Mentation: Alert oriented to time, place, history taking. Attention span and concentration appropriate. Recent and remote memory intact.  Follows all commands speech and language fluent.   Cranial nerve II-XII: Fundoscopic exam reveals sharp disc margins.Pupils were equal round reactive to light extraocular movements were full, visual field were full on confrontational test. Facial sensation and strength were normal. hearing was intact to finger rubbing bilaterally. Uvula tongue midline. head turning and shoulder shrug were normal and symmetric.Tongue protrusion into cheek strength was normal. Motor: normal bulk and tone, full strength in the BUE, BLE, fine finger movements normal, no pronator drift. No focal weakness Sensory: normal and symmetric to light touch, pinprick, and  Vibration, proprioception  Coordination: finger-nose-finger, heel-to-shin bilaterally, no dysmetria Reflexes: Brachioradialis 2/2, biceps 2/2, triceps 2/2, patellar 2/2, Achilles 2/2, plantar responses were flexor bilaterally. Gait and Station: Rising up from seated position without assistance, normal stance,  moderate stride, good arm swing, smooth turning, able to perform tiptoe, and heel walking without difficulty. Tandem gait  is steady  DIAGNOSTIC DATA (LABS, IMAGING, TESTING) - I reviewed patient records, labs, notes, testing and imaging myself where available.  Lab Results  Component Value Date   WBC 9.5 08/22/2017   HGB 12.5 08/22/2017   HCT 36.8 08/22/2017   MCV 92.2 08/22/2017   PLT 204 08/22/2017    ASSESSMENT AND PLAN  23 y.o. year old female  has a past medical history of Migraine and UTI (urinary tract infection). here with ***  Intractable migraine headache  2.  Current pregnancy  The medications we can use for the migraine are somewhat limited.  The patient will be started on amitriptyline, working up on the dose to 30 mg at night.  She will call for any dose adjustments.  The patient will be given Vicodin if needed for the headache, she can take Tylenol if needed.  The patient claims that she is incapacitated with a headache frequently.  She will follow-up in 3 months.  Nilda Riggs, [facility], [facility], APRN  [facility] [address] [place], [place] [address] [phone]

## 2018-03-14 ENCOUNTER — Telehealth: Payer: Self-pay

## 2018-03-14 ENCOUNTER — Ambulatory Visit: Payer: Medicaid Other | Admitting: Nurse Practitioner

## 2018-03-14 NOTE — Telephone Encounter (Signed)
Patient was a no call/no show for their appointment today.   

## 2018-03-15 ENCOUNTER — Encounter: Payer: Self-pay | Admitting: Nurse Practitioner

## 2018-04-05 ENCOUNTER — Inpatient Hospital Stay (HOSPITAL_COMMUNITY)
Admission: AD | Admit: 2018-04-05 | Discharge: 2018-04-07 | DRG: 807 | Disposition: A | Discharge status: Home / Self Care | Payer: Medicaid Other | Attending: Obstetrics and Gynecology | Admitting: Obstetrics and Gynecology

## 2018-04-05 ENCOUNTER — Inpatient Hospital Stay (HOSPITAL_COMMUNITY): Payer: Medicaid Other | Admitting: Anesthesiology

## 2018-04-05 ENCOUNTER — Encounter (HOSPITAL_COMMUNITY): Payer: Self-pay | Admitting: *Deleted

## 2018-04-05 ENCOUNTER — Other Ambulatory Visit: Payer: Self-pay

## 2018-04-05 DIAGNOSIS — Z3A38 38 weeks gestation of pregnancy: Secondary | ICD-10-CM

## 2018-04-05 DIAGNOSIS — F17210 Nicotine dependence, cigarettes, uncomplicated: Secondary | ICD-10-CM | POA: Diagnosis present

## 2018-04-05 DIAGNOSIS — Z6791 Unspecified blood type, Rh negative: Secondary | ICD-10-CM

## 2018-04-05 DIAGNOSIS — O26893 Other specified pregnancy related conditions, third trimester: Secondary | ICD-10-CM | POA: Diagnosis present

## 2018-04-05 DIAGNOSIS — O99334 Smoking (tobacco) complicating childbirth: Principal | ICD-10-CM | POA: Diagnosis present

## 2018-04-05 DIAGNOSIS — Z3490 Encounter for supervision of normal pregnancy, unspecified, unspecified trimester: Secondary | ICD-10-CM

## 2018-04-05 DIAGNOSIS — O2693 Pregnancy related conditions, unspecified, third trimester: Secondary | ICD-10-CM | POA: Diagnosis present

## 2018-04-05 LAB — CBC
HCT: 38.6 % (ref 36.0–46.0)
Hemoglobin: 13.2 g/dL (ref 12.0–15.0)
MCH: 32.9 pg (ref 26.0–34.0)
MCHC: 34.2 g/dL (ref 30.0–36.0)
MCV: 96.3 fL (ref 80.0–100.0)
Platelets: 183 10*3/uL (ref 150–400)
RBC: 4.01 MIL/uL (ref 3.87–5.11)
RDW: 13.5 % (ref 11.5–15.5)
WBC: 15.2 10*3/uL — ABNORMAL HIGH (ref 4.0–10.5)
nRBC: 0 % (ref 0.0–0.2)

## 2018-04-05 LAB — POCT FERN TEST: POCT Fern Test: POSITIVE

## 2018-04-05 MED ORDER — LACTATED RINGERS IV SOLN
INTRAVENOUS | Status: DC
  Administered 2018-04-05 (×3): via INTRAVENOUS

## 2018-04-05 MED ORDER — OXYTOCIN 40 UNITS IN NORMAL SALINE INFUSION - SIMPLE MED
2.5000 [IU]/h | INTRAVENOUS | Status: DC
  Administered 2018-04-05: 2.5 [IU]/h via INTRAVENOUS
  Filled 2018-04-05: qty 1000

## 2018-04-05 MED ORDER — PHENYLEPHRINE 40 MCG/ML (10ML) SYRINGE FOR IV PUSH (FOR BLOOD PRESSURE SUPPORT): 80.0000 ug | PREFILLED_SYRINGE | INTRAVENOUS | Status: DC | PRN

## 2018-04-05 MED ORDER — FENTANYL CITRATE (PF) 100 MCG/2ML IJ SOLN
100.0000 ug | INTRAMUSCULAR | Status: DC | PRN
  Administered 2018-04-05 (×4): 100 ug via INTRAVENOUS
  Filled 2018-04-05 (×4): qty 2

## 2018-04-05 MED ORDER — OXYTOCIN 40 UNITS IN NORMAL SALINE INFUSION - SIMPLE MED
1.0000 m[IU]/min | INTRAVENOUS | Status: DC
  Administered 2018-04-05: 2 m[IU]/min via INTRAVENOUS

## 2018-04-05 MED ORDER — PHENYLEPHRINE 40 MCG/ML (10ML) SYRINGE FOR IV PUSH (FOR BLOOD PRESSURE SUPPORT)
80.0000 ug | PREFILLED_SYRINGE | INTRAVENOUS | Status: DC | PRN
  Filled 2018-04-05: qty 10

## 2018-04-05 MED ORDER — LACTATED RINGERS IV SOLN: 500.0000 mL | Freq: Once | INTRAVENOUS | Status: DC

## 2018-04-05 MED ORDER — TERBUTALINE SULFATE 1 MG/ML IJ SOLN: 0.2500 mg | Freq: Once | INTRAMUSCULAR | Status: DC | PRN

## 2018-04-05 MED ORDER — ACETAMINOPHEN 325 MG PO TABS: 650.0000 mg | ORAL_TABLET | ORAL | Status: DC | PRN

## 2018-04-05 MED ORDER — SODIUM CHLORIDE (PF) 0.9 % IJ SOLN
INTRAMUSCULAR | Status: DC | PRN
  Administered 2018-04-05: 10 mL/h via EPIDURAL

## 2018-04-05 MED ORDER — ONDANSETRON HCL 4 MG/2ML IJ SOLN: 4.0000 mg | Freq: Four times a day (QID) | INTRAMUSCULAR | Status: DC | PRN

## 2018-04-05 MED ORDER — OXYCODONE-ACETAMINOPHEN 5-325 MG PO TABS: 2.0000 | ORAL_TABLET | ORAL | Status: DC | PRN

## 2018-04-05 MED ORDER — FENTANYL-BUPIVACAINE-NACL 0.5-0.125-0.9 MG/250ML-% EP SOLN
12.0000 mL/h | EPIDURAL | Status: DC | PRN
  Filled 2018-04-05: qty 250

## 2018-04-05 MED ORDER — EPHEDRINE 5 MG/ML INJ: 10.0000 mg | INTRAVENOUS | Status: DC | PRN

## 2018-04-05 MED ORDER — DIPHENHYDRAMINE HCL 50 MG/ML IJ SOLN: 12.5000 mg | INTRAMUSCULAR | Status: DC | PRN

## 2018-04-05 MED ORDER — SOD CITRATE-CITRIC ACID 500-334 MG/5ML PO SOLN: 30.0000 mL | ORAL | Status: DC | PRN

## 2018-04-05 MED ORDER — OXYTOCIN BOLUS FROM INFUSION
500.0000 mL | Freq: Once | INTRAVENOUS | Status: AC
  Administered 2018-04-05: 500 mL via INTRAVENOUS

## 2018-04-05 MED ORDER — LIDOCAINE HCL (PF) 1 % IJ SOLN
INTRAMUSCULAR | Status: DC | PRN
  Administered 2018-04-05: 5 mL via EPIDURAL

## 2018-04-05 MED ORDER — LIDOCAINE HCL (PF) 1 % IJ SOLN
30.0000 mL | INTRAMUSCULAR | Status: DC | PRN
  Filled 2018-04-05: qty 30

## 2018-04-05 MED ORDER — OXYCODONE-ACETAMINOPHEN 5-325 MG PO TABS: 1.0000 | ORAL_TABLET | ORAL | Status: DC | PRN

## 2018-04-05 MED ORDER — LACTATED RINGERS IV SOLN: 500.0000 mL | INTRAVENOUS | Status: DC | PRN

## 2018-04-05 NOTE — Anesthesia Preprocedure Evaluation (Addendum)
Anesthesia Evaluation  Patient identified by MRN, date of birth, ID band Patient awake    Reviewed: Allergy & Precautions, NPO status , Patient's Chart, lab work & pertinent test results  History of Anesthesia Complications Negative for: history of anesthetic complications  Airway Mallampati: II  TM Distance: >3 FB Neck ROM: Full   Comment: Tongue pierced Dental  (+) Dental Advisory Given   Pulmonary Current Smoker,    breath sounds clear to auscultation       Cardiovascular negative cardio ROS   Rhythm:Regular Rate:Normal     Neuro/Psych  Headaches,    GI/Hepatic Neg liver ROS, GERD  ,  Endo/Other  negative endocrine ROS  Renal/GU negative Renal ROS     Musculoskeletal   Abdominal   Peds  Hematology plt 183k   Anesthesia Other Findings   Reproductive/Obstetrics (+) Pregnancy                            Anesthesia Physical Anesthesia Plan  ASA: II  Anesthesia Plan: Epidural   Post-op Pain Management:    Induction:   PONV Risk Score and Plan: 1 and Treatment may vary due to age or medical condition  Airway Management Planned: Natural Airway  Additional Equipment:   Intra-op Plan:   Post-operative Plan:   Informed Consent: I have reviewed the patients History and Physical, chart, labs and discussed the procedure including the risks, benefits and alternatives for the proposed anesthesia with the patient or authorized representative who has indicated his/her understanding and acceptance.     Dental advisory given  Plan Discussed with:   Anesthesia Plan Comments: (Patient identified. Risks/Benefits/Options discussed with patient including but not limited to bleeding, infection, nerve damage, paralysis, failed block, incomplete pain control, headache, blood pressure changes, nausea, vomiting, reactions to medication both or allergic, itching and postpartum back pain. Confirmed  with bedside nurse the patient's most recent platelet count. Confirmed with patient that they are not currently taking any anticoagulation, have any bleeding history or any family history of bleeding disorders. Patient expressed understanding and wished to proceed. All questions were answered. )       Anesthesia Quick Evaluation

## 2018-04-05 NOTE — Progress Notes (Signed)
Pt standing @ bedside, EFM not tracing well secondary maternal position.
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
## 2018-04-05 NOTE — Anesthesia Pain Management Evaluation Note (Signed)
  CRNA Pain Management Visit Note  Patient: Angela Cunningham, 23 y.o., female  "Hello I am a member of the anesthesia team at [facility] and [facility]. We have an anesthesia team available at all times to provide care throughout the hospital, including epidural management and anesthesia for C-section. I don't know your plan for the delivery whether it a natural birth, water birth, IV sedation, nitrous supplementation, doula or epidural, but we want to meet your pain goals."   1.Was your pain managed to your expectations on prior hospitalizations?   No prior hospitalizations  2.What is your expectation for pain management during this hospitalization?     Labor support without medications  3.How can we help you reach that goal? States her goal is no epidural but wants to have the option to have epidural placed should that become necessary, would like quick response time for placement - educated about variable progression of labor and important to let anesthesia know about epidural early enough so that patient still able to get one, pain goal below is what she would expect with an epidural placed   Record the patient's initial score and the patient's pain goal.   Pain: 2  Pain Goal: 1 [facility] wants you to be able to say your pain was always managed very well.  Mauricia Area 04/05/2018

## 2018-04-05 NOTE — Anesthesia Procedure Notes (Signed)
Epidural Patient location during procedure: OB Start time: 04/05/2018 6:07 PM End time: 04/05/2018 6:24 PM  Staffing Anesthesiologist: Jairo Ben, MD Performed: anesthesiologist   Preanesthetic Checklist Completed: patient identified, surgical consent, pre-op evaluation, timeout performed, IV checked, risks and benefits discussed and monitors and equipment checked  Epidural Patient position: sitting Prep: site prepped and draped and DuraPrep Patient monitoring: blood pressure, continuous pulse ox and heart rate Approach: midline Location: L3-L4 Injection technique: LOR air  Needle:  Needle type: Tuohy  Needle gauge: 17 G Needle length: 9 cm Needle insertion depth: 5.5 cm Catheter type: closed end flexible Catheter size: 19 Gauge Catheter at skin depth: 11 cm Test dose: negative (1% lidocaine)  Assessment Events: blood not aspirated, injection not painful, no injection resistance, negative IV test and no paresthesia  Additional Notes Pt identified in Labor room.  Monitors applied. Working IV access confirmed. Sterile prep, drape lumbar spine.  1% lido local L 3,4.  #17ga Touhy LOR air at 5.5 cm L 3,4, cath in easily to 11 cm skin. Test dose OK, cath dosed and infusion begun.  Patient asymptomatic, VSS, no heme aspirated, tolerated well.  Sandford Craze, MDReason for block:procedure for pain

## 2018-04-05 NOTE — H&P (Signed)
Angela Cunningham is an 23 y.o. G1P0 [redacted]w[redacted]d white female who presents to the ER with SROM. Her preg was uncomplicated. Nl OGTT  Past Medical History:  Diagnosis Date  . Migraine   . UTI (urinary tract infection)     Past Surgical History:  Procedure Laterality Date  . NO PAST SURGERIES    . WISDOM TOOTH EXTRACTION      Family History  Problem Relation Age of Onset  . Migraines Mother   . Migraines Father   . Migraines Brother    Social History:  reports that she has been smoking. She has been smoking about 0.25 packs per day. She has never used smokeless tobacco. She reports previous drug use. Drug: Marijuana. She reports that she does not drink alcohol.  Allergies: No Known Allergies  Medications Prior to Admission  Medication Sig Dispense Refill  . fluconazole (DIFLUCAN) 150 MG tablet Take 150 mg by mouth daily.    . Prenatal Vit-Fe Fumarate-FA (PRENATAL COMPLETE) 14-0.4 MG TABS Take 1 tablet by mouth daily. 30 each 0  . amitriptyline (ELAVIL) 10 MG tablet Take one tablet at night for one week, then take 2 tablets at night for one week, then take 3 tablets at night. (Patient not taking: Reported on 04/05/2018) 90 tablet 3       Blood pressure 116/64, pulse (!) 118, temperature 98.1 F (36.7 C), temperature source Oral, resp. rate 20, height 5\' 2"  (1.575 m), weight 87 kg, last menstrual period 07/10/2017, SpO2 97 %. General appearance: alert and cooperative Abdomen: gravid, non tender   Lab Results  Component Value Date   WBC 15.2 (H) 04/05/2018   HGB 13.2 04/05/2018   HCT 38.6 04/05/2018   MCV 96.3 04/05/2018   PLT 183 04/05/2018   Lab Results  Component Value Date   PREGTESTUR POSITIVE (A) 08/20/2017      Patient Active Problem List   Diagnosis Date Noted  . Normal labor 04/05/2018  . Common migraine with intractable migraine 11/04/2017   IMP/ IUP at term with SROM Plan/ Admit  Nimrod Wendt E 04/05/2018, 9:05 PM

## 2018-04-06 DIAGNOSIS — Z3490 Encounter for supervision of normal pregnancy, unspecified, unspecified trimester: Secondary | ICD-10-CM

## 2018-04-06 LAB — CBC
HEMATOCRIT: 35.3 % — AB (ref 36.0–46.0)
Hemoglobin: 12.1 g/dL (ref 12.0–15.0)
MCH: 32.7 pg (ref 26.0–34.0)
MCHC: 34.3 g/dL (ref 30.0–36.0)
MCV: 95.4 fL (ref 80.0–100.0)
Platelets: 194 10*3/uL (ref 150–400)
RBC: 3.7 MIL/uL — ABNORMAL LOW (ref 3.87–5.11)
RDW: 13.5 % (ref 11.5–15.5)
WBC: 19.6 10*3/uL — ABNORMAL HIGH (ref 4.0–10.5)
nRBC: 0 % (ref 0.0–0.2)

## 2018-04-06 LAB — RPR: RPR Ser Ql: NONREACTIVE

## 2018-04-06 MED ORDER — WITCH HAZEL-GLYCERIN EX PADS
1.0000 "application " | MEDICATED_PAD | CUTANEOUS | Status: DC | PRN
  Administered 2018-04-06: 1 via TOPICAL

## 2018-04-06 MED ORDER — COCONUT OIL OIL: 1.0000 "application " | TOPICAL_OIL | Status: DC | PRN

## 2018-04-06 MED ORDER — TETANUS-DIPHTH-ACELL PERTUSSIS 5-2.5-18.5 LF-MCG/0.5 IM SUSP: 0.5000 mL | Freq: Once | INTRAMUSCULAR | Status: DC

## 2018-04-06 MED ORDER — MEASLES, MUMPS & RUBELLA VAC IJ SOLR: 0.5000 mL | Freq: Once | INTRAMUSCULAR | Status: DC

## 2018-04-06 MED ORDER — SIMETHICONE 80 MG PO CHEW: 80.0000 mg | CHEWABLE_TABLET | ORAL | Status: DC | PRN

## 2018-04-06 MED ORDER — ONDANSETRON HCL 4 MG/2ML IJ SOLN: 4.0000 mg | INTRAMUSCULAR | Status: DC | PRN

## 2018-04-06 MED ORDER — SENNOSIDES-DOCUSATE SODIUM 8.6-50 MG PO TABS
2.0000 | ORAL_TABLET | ORAL | Status: DC
  Administered 2018-04-06 – 2018-04-07 (×2): 2 via ORAL
  Filled 2018-04-06 (×2): qty 2

## 2018-04-06 MED ORDER — ACETAMINOPHEN 325 MG PO TABS: 650.0000 mg | ORAL_TABLET | ORAL | Status: DC | PRN

## 2018-04-06 MED ORDER — ZOLPIDEM TARTRATE 5 MG PO TABS: 5.0000 mg | ORAL_TABLET | Freq: Every evening | ORAL | Status: DC | PRN

## 2018-04-06 MED ORDER — OXYCODONE-ACETAMINOPHEN 5-325 MG PO TABS: 2.0000 | ORAL_TABLET | ORAL | Status: DC | PRN

## 2018-04-06 MED ORDER — OXYCODONE-ACETAMINOPHEN 5-325 MG PO TABS: 1.0000 | ORAL_TABLET | ORAL | Status: DC | PRN

## 2018-04-06 MED ORDER — DIBUCAINE 1 % RE OINT: 1.0000 "application " | TOPICAL_OINTMENT | RECTAL | Status: DC | PRN

## 2018-04-06 MED ORDER — BENZOCAINE-MENTHOL 20-0.5 % EX AERO
1.0000 "application " | INHALATION_SPRAY | CUTANEOUS | Status: DC | PRN
  Administered 2018-04-06 – 2018-04-07 (×2): 1 via TOPICAL
  Filled 2018-04-06 (×2): qty 56

## 2018-04-06 MED ORDER — IBUPROFEN 600 MG PO TABS
600.0000 mg | ORAL_TABLET | Freq: Four times a day (QID) | ORAL | Status: DC
  Administered 2018-04-06 – 2018-04-07 (×7): 600 mg via ORAL
  Filled 2018-04-06 (×7): qty 1

## 2018-04-06 MED ORDER — ONDANSETRON HCL 4 MG PO TABS: 4.0000 mg | ORAL_TABLET | ORAL | Status: DC | PRN

## 2018-04-06 NOTE — Progress Notes (Signed)
Patient is doing well.  She is ambulating, voiding, tolerating PO.  Pain control is good.  Lochia is appropriate  Vitals:   04/05/18 2145 04/05/18 2255 04/06/18 0045 04/06/18 0545  BP: 113/68 115/70 (!) 111/58 121/71  Pulse: (!) 107 94 (!) 110 75  Resp:   18 18  Temp:  99 F (37.2 C) 99.2 F (37.3 C) 97.6 F (36.4 C)  TempSrc:  Oral Oral Oral  SpO2:    99%  Weight:      Height:        NAD Fundus firm Ext: trace edema b/l  Lab Results  Component Value Date   WBC 19.6 (H) 04/06/2018   HGB 12.1 04/06/2018   HCT 35.3 (L) 04/06/2018   MCV 95.4 04/06/2018   PLT 194 04/06/2018    --/--/O NEG (02/26 0925)/RImmune  A/P 22 y.o. G1P1001 PPD#1. Routine care.   Rh neg--baby O neg, rhogam not indicated  Desires circ--baby not yet ready  [facility] GEFFEL [employer]

## 2018-04-06 NOTE — Lactation Note (Signed)
This note was copied from a baby's chart. Lactation Consultation Note Baby 6 hrs old at time of consult. Mom stated baby hasn't fed much. Has been sleepy. Baby sleeping in mom's arms, STS. Mom has large pendulous breast. Nipples flat at rest. Evert w/stimulation. Mom had nipples pierced, colostrum excretes from piercing holes.  Mom has very long artificial nails making difficult for latching.  Baby very sleepy. Removed baby from mom's arms. Baby cried.  Repositioned baby in mom's arms in football position, baby wouldn't latch. Cried.  Demonstrated to mom hand expression. Mom demonstrated w/o colostrum, mom not compressing enough. LC collected 2 ml colostrum. Spoon fed baby w/much stimulation. Baby has recessed chin, bottom lip recessed as well. Demonstrated chin tug. Newborn feeding habits, STS, I&O, discussed. Encouraged mom to call for assistance or questions. Newborn BF brochure at bedside,.  Patient Name: Angela Cunningham [id number] Date: 04/06/2018 Reason for consult: Initial assessment;1st time breastfeeding   Maternal Data Has patient been taught Hand Expression?: Yes Does the patient have breastfeeding experience prior to this delivery?: No  Feeding Feeding Type: Breast Milk  LATCH Score Latch: Too sleepy or reluctant, no latch achieved, no sucking elicited.  Audible Swallowing: None  Type of Nipple: Flat(compressible)  Comfort (Breast/Nipple): Soft / non-tender  Hold (Positioning): Full assist, staff holds infant at breast  LATCH Score: 3  Interventions Interventions: Breast feeding basics reviewed;Adjust position;Assisted with latch;Support pillows;Skin to skin;Position options;Breast massage;Expressed milk;Hand express;Breast compression;Hand pump  Lactation Tools Discussed/Used WIC Program: No   Consult Status Consult Status: Follow-up Date: 04/06/18 Follow-up type: In-patient    Charyl Dancer 04/06/2018, 5:46 AM

## 2018-04-06 NOTE — Anesthesia Postprocedure Evaluation (Signed)
Anesthesia Post Note  Patient: Angela Cunningham  Procedure(s) Performed: AN AD HOC LABOR EPIDURAL     Patient location during evaluation: Mother Baby Anesthesia Type: Epidural Level of consciousness: awake Pain management: satisfactory to patient Vital Signs Assessment: post-procedure vital signs reviewed and stable Respiratory status: spontaneous breathing Cardiovascular status: stable Anesthetic complications: no    Last Vitals:  Vitals:   04/06/18 0045 04/06/18 0545  BP: (!) 111/58 121/71  Pulse: (!) 110 75  Resp: 18 18  Temp: 37.3 C 36.4 C  SpO2:  99%    Last Pain:  Vitals:   04/06/18 0719  TempSrc:   PainSc: Asleep   Pain Goal: Patients Stated Pain Goal: 0 (04/05/18 0741)                 Cephus Shelling

## 2018-04-06 NOTE — Progress Notes (Signed)
Angela Cunningham was referred for history of depression/anxiety. * Referral screened out by Clinical Social Worker because none of the following criteria appear to apply: ~ History of anxiety/depression during this pregnancy, or of post-partum depression following prior delivery. ~ Diagnosis of anxiety and/or depression within last 3 years OR * Angela Cunningham's symptoms currently being treated with medication and/or therapy. Angela Cunningham has a Rx for Elavil.   Please contact the Clinical Social Worker if needs arise, by Angela Cunningham request, or if Angela Cunningham scores greater than 9/yes to question 10 on Edinburgh Postpartum Depression Screen.  Karlei Waldo Boyd-Gilyard, MSW, LCSW Clinical Social Work [phone] 

## 2018-04-06 NOTE — Lactation Note (Signed)
This note was copied from a baby's chart. Lactation Consultation Note  Patient Name: Angela Cunningham Date: 04/06/2018 Reason for consult: Follow-up assessment;Primapara;1st time breastfeeding;Difficult latch;Early term 43-38.6wks  Visited with P1 Mom of ET infant at 31 hrs old.  Mom hasn't been able to latch baby.  Mom has large, heavy breasts with flat nipples.  Areola very compressible.  Reviewed breast massage and hand expression, colostrum easily expressed.    Baby has a small mouth and recessed chin.  After several attempts at latching, initiated a 24 mm nipple shield.  Mom shown how to properly apply nipple shield, and nipple pulled well into shield.   Assisted Mom with a rolled up cloth diaper under breast to support breast.  Baby positioned in football hold under right breast.  Baby opened up and chin pulled to over mouth wider and uncurl lower lip.  Baby would give a few suck, and a few swallows.  Mom taught ways to help keep baby stimulated when at the breast, including breast compression.    Set up DEBP at bedside, and instructed Mom to double pump on initiation setting after baby breastfeeds.  Mom comfortable with hand expressing into a spoon.   Encouraged keep baby STS on chest as much as possible  Plan- 1- Using nipple shield, latch baby STS  2- Pump both breasts 15 mins on initiation setting. 3- Feed baby any EBM expressed. 4- If baby isn't waking up and feeding better by 24 hrs age, may need to supplement with formula.     Feeding Feeding Type: Breast Fed  LATCH Score Latch: Repeated attempts needed to sustain latch, nipple held in mouth throughout feeding, stimulation needed to elicit sucking reflex.  Audible Swallowing: A few with stimulation  Type of Nipple: Flat  Comfort (Breast/Nipple): Soft / non-tender  Hold (Positioning): Assistance needed to correctly position infant at breast and maintain latch.  LATCH Score:  6  Interventions Interventions: Breast feeding basics reviewed;Assisted with latch;Skin to skin;Breast massage;Hand express;Pre-pump if needed;Breast compression;Adjust position;Support pillows;Position options;Expressed milk;DEBP  Lactation Tools Discussed/Used Tools: Pump;Nipple Shields Nipple shield size: 24 Breast pump type: Double-Electric Breast Pump WIC Program: No Pump Review: Setup, frequency, and cleaning;Milk Storage Initiated by:: Angela Pian RN IBCLC Date initiated:: 04/06/18   Consult Status Consult Status: Follow-up Date: 04/07/18 Follow-up type: In-patient    Angela Cunningham 04/06/2018, 3:46 PM

## 2018-04-07 NOTE — Lactation Note (Signed)
This note was copied from a baby's chart. Lactation Consultation Note  Patient Name: [name] [id number] Date: 04/07/2018 Reason for consult: Follow-up assessment   Baby 36 hours old.  Mother states she has been latching baby without nipple shield. Discussed the need for pumping if she starts using it again on a regular basis. Pacifier use not recommended at this time.  Reviewed engorgement care and monitoring voids/stools. Feed on demand approximately 8-12 times per day.      Maternal Data    Feeding Feeding Type: Breast Fed  LATCH Score Latch: Grasps breast easily, tongue down, lips flanged, rhythmical sucking.  Audible Swallowing: A few with stimulation  Type of Nipple: Everted at rest and after stimulation  Comfort (Breast/Nipple): Filling, red/small blisters or bruises, mild/mod discomfort  Hold (Positioning): No assistance needed to correctly position infant at breast.  LATCH Score: 8  Interventions Interventions: Breast feeding basics reviewed  Lactation Tools Discussed/Used     Consult Status Consult Status: Complete Date: 04/07/18    Dahlia Byes [facility] 04/07/2018, 9:33 AM

## 2018-04-07 NOTE — Discharge Summary (Signed)
Obstetric Discharge Summary Reason for Admission: rupture of membranes Prenatal Procedures: ultrasound Intrapartum Procedures: spontaneous vaginal delivery Postpartum Procedures: none Complications-Operative and Postpartum: 2nd degree perineal laceration Hemoglobin  Date Value Ref Range Status  04/06/2018 12.1 12.0 - 15.0 g/dL Final   HCT  Date Value Ref Range Status  04/06/2018 35.3 (L) 36.0 - 46.0 % Final    Physical Exam:  General: alert and cooperative Lochia: appropriate Uterine Fundus: firm DVT Evaluation: No evidence of DVT seen on physical exam.  Discharge Diagnoses: Term Pregnancy-delivered  Discharge Information: Date: 04/07/2018 Activity: pelvic rest Diet: routine Medications: PNV and Ibuprofen Condition: stable Instructions: refer to practice specific booklet Discharge to: home Follow-up Information    Levi Aland, MD Follow up in 4 week(s).   Specialty:  Obstetrics and Gynecology Contact information: [address] [place] [address] [phone]           Newborn Data: Live born female  Birth Weight: 8 lb 6.4 oz (3810 g) APGAR: 8, 9  Newborn Delivery   Birth date/time:  04/05/2018 20:35:00 Delivery type:  Vaginal, Spontaneous     Home with mother.  Angela Cunningham 04/07/2018, 9:56 AM

## 2018-04-07 NOTE — Lactation Note (Signed)
This note was copied from a baby's chart. Lactation Consultation Note Mom states BF going well. Baby BF last 2 feedings w/o NS. Nipples flat very compressible. Mom has used hand pump w/glistening of colostrum on flange. Asked mom to hand express, colostrum easily noted. Encouraged to massage breast, hand express colostrum. Mom excited to see colostrum coming so easy.  Encouraged to cont. Feed STS, give colostrum after feeding as desert.  Pumping and wearing her bra today encouraged. Encouraged to call for further questions or assistance.  Patient Name: Angela Cunningham [id number] Date: 04/07/2018 Reason for consult: Follow-up assessment;1st time breastfeeding   Maternal Data    Feeding    LATCH Score                   Interventions    Lactation Tools Discussed/Used     Consult Status Consult Status: Follow-up Date: 04/08/18 Follow-up type: In-patient    Charyl Dancer 04/07/2018, 4:57 AM

## 2018-04-08 LAB — TYPE AND SCREEN
ABO/RH(D): O NEG
Antibody Screen: POSITIVE
Unit division: 0
Unit division: 0

## 2018-04-08 LAB — BPAM RBC
BLOOD PRODUCT EXPIRATION DATE: 202003202359
Blood Product Expiration Date: 202003192359
Unit Type and Rh: 9500
Unit Type and Rh: 9500

## 2019-01-02 LAB — OB RESULTS CONSOLE RUBELLA ANTIBODY, IGM: Rubella: IMMUNE

## 2019-01-02 LAB — OB RESULTS CONSOLE RPR: RPR: NONREACTIVE

## 2019-01-02 LAB — HEPATITIS C ANTIBODY: HCV Ab: NEGATIVE

## 2019-01-02 LAB — OB RESULTS CONSOLE HIV ANTIBODY (ROUTINE TESTING): HIV: NONREACTIVE

## 2019-01-02 LAB — OB RESULTS CONSOLE GC/CHLAMYDIA
Chlamydia: NEGATIVE
Gonorrhea: NEGATIVE

## 2019-01-02 LAB — OB RESULTS CONSOLE HEPATITIS B SURFACE ANTIGEN: Hepatitis B Surface Ag: NEGATIVE

## 2019-02-09 NOTE — L&D Delivery Note (Signed)
Delivery Note Angela Cunningham is a G2P1001 at [redacted]w[redacted]d who had a spontaneous delivery at 15:21 on 08/05/19  a viable female was delivered via  ROA.  APGAR: 8, 9; weight 3666 g (8lb1.3oz)  Admitted for labor, on admit 5.5cm, received epidural for pain management. Augmented with AROM. Progressed normally. Pushed for approximately 20 minutes. Baby was delivered without difficulty. No nuchal cord. Baby placed on maternal abdomen. Delayed cord clamping for 60 seconds. Delivery of placenta was spontaneous. Placenta was found to be intact, 3-vessel cord was noted. The fundus was found to be firm. 1st degree perineal laceration was repaired in the normal sterile fashion with 2-0 vicryl. Estimated blood loss 200cc. Instrument and gauze counts were correct at the end of the procedure.  Placenta status: L&D Mom to postpartum.  Baby to Couplet care / Skin to Skin.  Andrw Mcguirt K Taam-Akelman 08/05/2019, 3:40 PM

## 2019-05-15 LAB — OB RESULTS CONSOLE RPR: RPR: NONREACTIVE

## 2019-07-10 LAB — OB RESULTS CONSOLE GC/CHLAMYDIA
Chlamydia: NEGATIVE
Gonorrhea: NEGATIVE

## 2019-07-10 LAB — OB RESULTS CONSOLE GBS: GBS: NEGATIVE

## 2019-07-30 ENCOUNTER — Encounter (HOSPITAL_COMMUNITY): Payer: Self-pay | Admitting: *Deleted

## 2019-07-30 ENCOUNTER — Inpatient Hospital Stay (HOSPITAL_COMMUNITY)
Admission: AD | Admit: 2019-07-30 | Discharge: 2019-07-30 | Disposition: A | Discharge status: Home / Self Care | Payer: Medicaid Other | Attending: Obstetrics and Gynecology | Admitting: Obstetrics and Gynecology

## 2019-07-30 DIAGNOSIS — O479 False labor, unspecified: Secondary | ICD-10-CM

## 2019-07-30 DIAGNOSIS — O471 False labor at or after 37 completed weeks of gestation: Secondary | ICD-10-CM | POA: Insufficient documentation

## 2019-07-30 DIAGNOSIS — Z3A39 39 weeks gestation of pregnancy: Secondary | ICD-10-CM | POA: Diagnosis not present

## 2019-07-30 NOTE — MAU Note (Signed)
Pt stated she has been having tightening in her abd off an on all night. Where painful at times but not so much now but tighting is about 5-6 min aprt. Good fetal movement felt. Reports mucus plug came out. Denies any vag bleeding or leaking at this time. 3cm at last exam 4 days ago.

## 2019-07-30 NOTE — Discharge Instructions (Signed)
Fetal Movement Counts Patient Name: ________________________________________________ Patient Due Date: ____________________ What is a fetal movement count?  A fetal movement count is the number of times that you feel your baby move during a certain amount of time. This may also be called a fetal kick count. A fetal movement count is recommended for every pregnant woman. You may be asked to start counting fetal movements as early as week 28 of your pregnancy. Pay attention to when your baby is most active. You may notice your baby's sleep and wake cycles. You may also notice things that make your baby move more. You should do a fetal movement count:  When your baby is normally most active.  At the same time each day. A good time to count movements is while you are resting, after having something to eat and drink. How do I count fetal movements? 1. Find a quiet, comfortable area. Sit, or lie down on your side. 2. Write down the date, the start time and stop time, and the number of movements that you felt between those two times. Take this information with you to your health care visits. 3. Write down your start time when you feel the first movement. 4. Count kicks, flutters, swishes, rolls, and jabs. You should feel at least 10 movements. 5. You may stop counting after you have felt 10 movements, or if you have been counting for 2 hours. Write down the stop time. 6. If you do not feel 10 movements in 2 hours, contact your health care provider for further instructions. Your health care provider may want to do additional tests to assess your baby's well-being. Contact a health care provider if:  You feel fewer than 10 movements in 2 hours.  Your baby is not moving like he or she usually does. Date: ____________ Start time: ____________ Stop time: ____________ Movements: ____________ Date: ____________ Start time: ____________ Stop time: ____________ Movements: ____________ Date: ____________  Start time: ____________ Stop time: ____________ Movements: ____________ Date: ____________ Start time: ____________ Stop time: ____________ Movements: ____________ Date: ____________ Start time: ____________ Stop time: ____________ Movements: ____________ Date: ____________ Start time: ____________ Stop time: ____________ Movements: ____________ Date: ____________ Start time: ____________ Stop time: ____________ Movements: ____________ Date: ____________ Start time: ____________ Stop time: ____________ Movements: ____________ Date: ____________ Start time: ____________ Stop time: ____________ Movements: ____________ This information is not intended to replace advice given to you by your health care provider. Make sure you discuss any questions you have with your health care provider. Document Revised: 09/14/2018 Document Reviewed: 09/14/2018 Elsevier Patient Education  2020 Else[employer] Braxton Hicks Contractions Contractions of the uterus can occur throughout pregnancy, but they are not always a sign that you are in labor. You may have practice contractions called Braxton Hicks contractions. These false labor contractions are sometimes confused with true labor. What are Braxton Hicks contractions? Braxton Hicks contractions are tightening movements that occur in the muscles of the uterus before labor. Unlike true labor contractions, these contractions do not result in opening (dilation) and thinning of the cervix. Toward the end of pregnancy (32-34 weeks), Braxton Hicks contractions can happen more often and may become stronger. These contractions are sometimes difficult to tell apart from true labor because they can be very uncomfortable. You should not feel embarrassed if you go to the hospital with false labor. Sometimes, the only way to tell if you are in true labor is for your health care provider to look for changes in the cervix. The health care provider   will do a physical exam and may  monitor your contractions. If you are not in true labor, the exam should show that your cervix is not dilating and your water has not broken. If there are no other health problems associated with your pregnancy, it is completely safe for you to be sent home with false labor. You may continue to have Braxton Hicks contractions until you go into true labor. How to tell the difference between true labor and false labor True labor  Contractions last 30-70 seconds.  Contractions become very regular.  Discomfort is usually felt in the top of the uterus, and it spreads to the lower abdomen and low back.  Contractions do not go away with walking.  Contractions usually become more intense and increase in frequency.  The cervix dilates and gets thinner. False labor  Contractions are usually shorter and not as strong as true labor contractions.  Contractions are usually irregular.  Contractions are often felt in the front of the lower abdomen and in the groin.  Contractions may go away when you walk around or change positions while lying down.  Contractions get weaker and are shorter-lasting as time goes on.  The cervix usually does not dilate or become thin. Follow these instructions at home:   Take over-the-counter and prescription medicines only as told by your health care provider.  Keep up with your usual exercises and follow other instructions from your health care provider.  Eat and drink lightly if you think you are going into labor.  If Braxton Hicks contractions are making you uncomfortable: ? Change your position from lying down or resting to walking, or change from walking to resting. ? Sit and rest in a tub of warm water. ? Drink enough fluid to keep your urine pale yellow. Dehydration may cause these contractions. ? Do slow and deep breathing several times an hour.  Keep all follow-up prenatal visits as told by your health care provider. This is important. Contact a  health care provider if:  You have a fever.  You have continuous pain in your abdomen. Get help right away if:  Your contractions become stronger, more regular, and closer together.  You have fluid leaking or gushing from your vagina.  You pass blood-tinged mucus (bloody show).  You have bleeding from your vagina.  You have low back pain that you never had before.  You feel your baby's head pushing down and causing pelvic pressure.  Your baby is not moving inside you as much as it used to. Summary  Contractions that occur before labor are called Braxton Hicks contractions, false labor, or practice contractions.  Braxton Hicks contractions are usually shorter, weaker, farther apart, and less regular than true labor contractions. True labor contractions usually become progressively stronger and regular, and they become more frequent.  Manage discomfort from Braxton Hicks contractions by changing position, resting in a warm bath, drinking plenty of water, or practicing deep breathing. This information is not intended to replace advice given to you by your health care provider. Make sure you discuss any questions you have with your health care provider. Document Revised: 01/07/2017 Document Reviewed: 06/10/2016 Elsevier Patient Education  2020 Elsevier Inc.  

## 2019-07-30 NOTE — MAU Provider Note (Signed)
S: Patient is here for RN labor evaluation. Strip, vital signs, & chart Reviewed   O:  Vitals:   07/30/19 0416 07/30/19 0426  BP: 132/77 124/69  Pulse: (!) 104 (!) 102  Resp: 18   Temp: 98.2 F (36.8 C)   SpO2:  97%  Weight: 94.8 kg   Height: 5\' 3"  (1.6 m)    No results found for this or any previous visit (from the past 24 hour(s)).  Dilation: 2 Effacement (%): 50 Cervical Position: Posterior Station: -3 Presentation: Vertex Exam by:: [email or web address], RN   FHR: 135 bpm, Mod Var, no Decels, 15x15 Accels UC: irregular   A: 1. False labor   2. [redacted] weeks gestation of pregnancy      P:  RN to discharge home in stable condition with return precautions & fetal kick counts  Irish Elders FNP 6:08 AM

## 2019-07-30 NOTE — MAU Note (Signed)
I have communicated with Judeth Horn, NP and reviewed vital signs:  Vitals:   07/30/19 0426 07/30/19 0607  BP: 124/69 119/65  Pulse: (!) 102 98  Resp:    Temp:    SpO2: 97%     Vaginal exam:  Dilation: 2 Effacement (%): 50 Cervical Position: Posterior Station: -3 Presentation: Vertex Exam by:: Irish Elders, RN,   Also reviewed contraction pattern and that non-stress test is reactive.  It has been documented that patient is contracting OCCs with no cervical change over 1.5 hours not indicating active labor.  Patient denies any other complaints.  Based on this report provider has given order for discharge.  A discharge order and diagnosis entered by a provider.   Labor discharge instructions reviewed with patient.

## 2019-08-05 ENCOUNTER — Encounter (HOSPITAL_COMMUNITY): Payer: Self-pay | Admitting: Obstetrics & Gynecology

## 2019-08-05 ENCOUNTER — Other Ambulatory Visit: Payer: Self-pay

## 2019-08-05 ENCOUNTER — Inpatient Hospital Stay (HOSPITAL_COMMUNITY): Payer: Medicaid Other | Admitting: Anesthesiology

## 2019-08-05 ENCOUNTER — Inpatient Hospital Stay (HOSPITAL_COMMUNITY)
Admission: AD | Admit: 2019-08-05 | Discharge: 2019-08-06 | DRG: 807 | Disposition: A | Discharge status: Home / Self Care | Payer: Medicaid Other | Attending: Obstetrics & Gynecology | Admitting: Obstetrics & Gynecology

## 2019-08-05 DIAGNOSIS — Z3A39 39 weeks gestation of pregnancy: Secondary | ICD-10-CM | POA: Diagnosis not present

## 2019-08-05 DIAGNOSIS — Z87891 Personal history of nicotine dependence: Secondary | ICD-10-CM | POA: Diagnosis not present

## 2019-08-05 DIAGNOSIS — O26893 Other specified pregnancy related conditions, third trimester: Secondary | ICD-10-CM | POA: Diagnosis present

## 2019-08-05 DIAGNOSIS — Z20822 Contact with and (suspected) exposure to covid-19: Secondary | ICD-10-CM | POA: Diagnosis present

## 2019-08-05 DIAGNOSIS — Z6791 Unspecified blood type, Rh negative: Secondary | ICD-10-CM | POA: Diagnosis not present

## 2019-08-05 DIAGNOSIS — O99214 Obesity complicating childbirth: Secondary | ICD-10-CM | POA: Diagnosis present

## 2019-08-05 HISTORY — DX: Partial loss of teeth, unspecified cause, unspecified class: K08.409

## 2019-08-05 HISTORY — DX: Nicotine dependence, unspecified, uncomplicated: F17.200

## 2019-08-05 HISTORY — DX: Anxiety disorder, unspecified: F41.9

## 2019-08-05 LAB — CBC
HCT: 38.1 % (ref 36.0–46.0)
Hemoglobin: 13 g/dL (ref 12.0–15.0)
MCH: 32.6 pg (ref 26.0–34.0)
MCHC: 34.1 g/dL (ref 30.0–36.0)
MCV: 95.5 fL (ref 80.0–100.0)
Platelets: 157 10*3/uL (ref 150–400)
RBC: 3.99 MIL/uL (ref 3.87–5.11)
RDW: 13.4 % (ref 11.5–15.5)
WBC: 15.7 10*3/uL — ABNORMAL HIGH (ref 4.0–10.5)
nRBC: 0 % (ref 0.0–0.2)

## 2019-08-05 LAB — SARS CORONAVIRUS 2 BY RT PCR (HOSPITAL ORDER, PERFORMED IN ~~LOC~~ HOSPITAL LAB): SARS Coronavirus 2: NEGATIVE

## 2019-08-05 LAB — RPR: RPR Ser Ql: NONREACTIVE

## 2019-08-05 LAB — TYPE AND SCREEN
ABO/RH(D): O NEG
Antibody Screen: POSITIVE

## 2019-08-05 MED ORDER — BENZOCAINE-MENTHOL 20-0.5 % EX AERO
1.0000 "application " | INHALATION_SPRAY | CUTANEOUS | Status: DC | PRN
  Administered 2019-08-05: 1 via TOPICAL
  Filled 2019-08-05: qty 56

## 2019-08-05 MED ORDER — SOD CITRATE-CITRIC ACID 500-334 MG/5ML PO SOLN: 30.0000 mL | ORAL | Status: DC | PRN

## 2019-08-05 MED ORDER — OXYTOCIN BOLUS FROM INFUSION
333.0000 mL | Freq: Once | INTRAVENOUS | Status: AC
  Administered 2019-08-05: 333 mL via INTRAVENOUS

## 2019-08-05 MED ORDER — DIPHENHYDRAMINE HCL 50 MG/ML IJ SOLN: 12.5000 mg | INTRAMUSCULAR | Status: DC | PRN

## 2019-08-05 MED ORDER — EPHEDRINE 5 MG/ML INJ: 10.0000 mg | INTRAVENOUS | Status: DC | PRN

## 2019-08-05 MED ORDER — IBUPROFEN 600 MG PO TABS
600.0000 mg | ORAL_TABLET | Freq: Four times a day (QID) | ORAL | Status: DC
  Administered 2019-08-05 – 2019-08-06 (×4): 600 mg via ORAL
  Filled 2019-08-05 (×4): qty 1

## 2019-08-05 MED ORDER — WITCH HAZEL-GLYCERIN EX PADS: 1.0000 "application " | MEDICATED_PAD | CUTANEOUS | Status: DC | PRN

## 2019-08-05 MED ORDER — LIDOCAINE HCL (PF) 1 % IJ SOLN
INTRAMUSCULAR | Status: DC | PRN
  Administered 2019-08-05: 11 mL via EPIDURAL

## 2019-08-05 MED ORDER — COCONUT OIL OIL: 1.0000 "application " | TOPICAL_OIL | Status: DC | PRN

## 2019-08-05 MED ORDER — PRENATAL MULTIVITAMIN CH
1.0000 | ORAL_TABLET | Freq: Every day | ORAL | Status: DC
  Administered 2019-08-06: 1 via ORAL
  Filled 2019-08-05: qty 1

## 2019-08-05 MED ORDER — FENTANYL-BUPIVACAINE-NACL 0.5-0.125-0.9 MG/250ML-% EP SOLN
12.0000 mL/h | EPIDURAL | Status: DC | PRN
  Filled 2019-08-05: qty 250

## 2019-08-05 MED ORDER — TETANUS-DIPHTH-ACELL PERTUSSIS 5-2.5-18.5 LF-MCG/0.5 IM SUSP: 0.5000 mL | Freq: Once | INTRAMUSCULAR | Status: DC

## 2019-08-05 MED ORDER — SIMETHICONE 80 MG PO CHEW: 80.0000 mg | CHEWABLE_TABLET | ORAL | Status: DC | PRN

## 2019-08-05 MED ORDER — ACETAMINOPHEN 325 MG PO TABS: 650.0000 mg | ORAL_TABLET | ORAL | Status: DC | PRN

## 2019-08-05 MED ORDER — LACTATED RINGERS IV SOLN
500.0000 mL | Freq: Once | INTRAVENOUS | Status: AC
  Administered 2019-08-05: 500 mL via INTRAVENOUS

## 2019-08-05 MED ORDER — ONDANSETRON HCL 4 MG/2ML IJ SOLN: 4.0000 mg | INTRAMUSCULAR | Status: DC | PRN

## 2019-08-05 MED ORDER — PHENYLEPHRINE 40 MCG/ML (10ML) SYRINGE FOR IV PUSH (FOR BLOOD PRESSURE SUPPORT)
80.0000 ug | PREFILLED_SYRINGE | INTRAVENOUS | Status: DC | PRN
  Filled 2019-08-05: qty 10

## 2019-08-05 MED ORDER — LIDOCAINE HCL (PF) 1 % IJ SOLN: 30.0000 mL | INTRAMUSCULAR | Status: DC | PRN

## 2019-08-05 MED ORDER — FENTANYL CITRATE (PF) 100 MCG/2ML IJ SOLN
50.0000 ug | Freq: Once | INTRAMUSCULAR | Status: AC
  Administered 2019-08-05: 50 ug via INTRAVENOUS

## 2019-08-05 MED ORDER — LACTATED RINGERS IV SOLN
500.0000 mL | INTRAVENOUS | Status: DC | PRN
  Administered 2019-08-05: 500 mL via INTRAVENOUS
  Administered 2019-08-05: 1000 mL via INTRAVENOUS

## 2019-08-05 MED ORDER — OXYCODONE HCL 5 MG PO TABS: 5.0000 mg | ORAL_TABLET | ORAL | Status: DC | PRN

## 2019-08-05 MED ORDER — ONDANSETRON HCL 4 MG/2ML IJ SOLN: 4.0000 mg | Freq: Four times a day (QID) | INTRAMUSCULAR | Status: DC | PRN

## 2019-08-05 MED ORDER — DOCUSATE SODIUM 100 MG PO CAPS
100.0000 mg | ORAL_CAPSULE | Freq: Two times a day (BID) | ORAL | Status: DC
  Administered 2019-08-05 – 2019-08-06 (×2): 100 mg via ORAL
  Filled 2019-08-05 (×2): qty 1

## 2019-08-05 MED ORDER — ONDANSETRON HCL 4 MG PO TABS: 4.0000 mg | ORAL_TABLET | ORAL | Status: DC | PRN

## 2019-08-05 MED ORDER — PHENYLEPHRINE 40 MCG/ML (10ML) SYRINGE FOR IV PUSH (FOR BLOOD PRESSURE SUPPORT): 80.0000 ug | PREFILLED_SYRINGE | INTRAVENOUS | Status: DC | PRN

## 2019-08-05 MED ORDER — FENTANYL CITRATE (PF) 100 MCG/2ML IJ SOLN
INTRAMUSCULAR | Status: AC
  Filled 2019-08-05: qty 2

## 2019-08-05 MED ORDER — SODIUM CHLORIDE 0.9% FLUSH: 3.0000 mL | Freq: Two times a day (BID) | INTRAVENOUS | Status: DC

## 2019-08-05 MED ORDER — OXYTOCIN-SODIUM CHLORIDE 30-0.9 UT/500ML-% IV SOLN
2.5000 [IU]/h | INTRAVENOUS | Status: DC
  Filled 2019-08-05: qty 500

## 2019-08-05 MED ORDER — SODIUM CHLORIDE (PF) 0.9 % IJ SOLN
INTRAMUSCULAR | Status: DC | PRN
  Administered 2019-08-05: 12 mL/h via EPIDURAL

## 2019-08-05 MED ORDER — SODIUM CHLORIDE 0.9% FLUSH: 3.0000 mL | INTRAVENOUS | Status: DC | PRN

## 2019-08-05 MED ORDER — DIBUCAINE (PERIANAL) 1 % EX OINT: 1.0000 "application " | TOPICAL_OINTMENT | CUTANEOUS | Status: DC | PRN

## 2019-08-05 MED ORDER — LACTATED RINGERS IV SOLN: INTRAVENOUS | Status: DC

## 2019-08-05 MED ORDER — SODIUM CHLORIDE 0.9 % IV SOLN: 250.0000 mL | INTRAVENOUS | Status: DC | PRN

## 2019-08-05 MED ORDER — OXYCODONE HCL 5 MG PO TABS: 10.0000 mg | ORAL_TABLET | ORAL | Status: DC | PRN

## 2019-08-05 MED ORDER — DIPHENHYDRAMINE HCL 25 MG PO CAPS: 25.0000 mg | ORAL_CAPSULE | Freq: Four times a day (QID) | ORAL | Status: DC | PRN

## 2019-08-05 NOTE — Progress Notes (Signed)
OBGYN Note Angela Cunningham 23 y.o. G2P1001 at [redacted]w[redacted]d with labor Vitals:   08/05/19 1215 08/05/19 1220 08/05/19 1230 08/05/19 1300  BP: 101/64 (!) 100/52 112/63 115/73  Pulse: 96 98 97 90  Resp: 18 18 18 18   Temp:    98.1 F (36.7 C)  TempSrc:    Oral  SpO2: 99% 99% 98%   Weight:      Height:       FHR 140, Cat 1 q32m SVE 7/100/0, AROM clear fluid -Anticipate SVD -Pitocin prn Konstantin Lehnen K Taam-Akelman 08/05/19 1:04 PM

## 2019-08-05 NOTE — Lactation Note (Signed)
This note was copied from a baby's chart. Lactation Consultation Note  Patient Name: Angela Cunningham [id number] Date: 08/05/2019 Reason for consult: Initial assessment;Term P2, 5 hour term female infant. Infant hod one stool since birth. Per mom, she briefly BF her 30 month old for one month.  Mom is active on the [facility] in [facility] and she has DEBP at home. LC entered room, RN was helping mom with set up DEBP, this is mom's choice to pump. Mom was also given hand pump, LC refitted mom with size 27 mm breast flanges. Mom latched infant on her left breast using the football hold position, infant latched wide mouth, nose and chin touching breast, top lip flanged outward, swallows heard, infant breast fed for 25 minutes. LC instructed mom to do breast stimulation while breastfeeding infant, breast compressions, stroking infant at the base of his neck and talking to infant while breastfeeding. LC ask mom to avoid holding breast in scissor hand position but rather use the "C' or "U hold to support breast.  Mom knows to breastfeed infant according to hunger cues, 8 to 12+ times and not to exceed 3 hours without BF infant. Mom made aware of O/P services, breastfeeding support groups, community resources, and our phone # for post-discharge questions.  Maternal Data Formula Feeding for Exclusion: No Has patient been taught Hand Expression?: Yes Does the patient have breastfeeding experience prior to this delivery?: Yes  Feeding Feeding Type: Breast Fed  LATCH Score Latch: Grasps breast easily, tongue down, lips flanged, rhythmical sucking.  Audible Swallowing: Spontaneous and intermittent  Type of Nipple: Everted at rest and after stimulation  Comfort (Breast/Nipple): Soft / non-tender  Hold (Positioning): Assistance needed to correctly position infant at breast and maintain latch.  LATCH Score: 9  Interventions Interventions: Breast feeding basics reviewed;Assisted with  latch;Breast compression;Skin to skin;Adjust position;Breast massage;Support pillows;Position options;DEBP  Lactation Tools Discussed/Used WIC Program: Yes Pump Review: Setup, frequency, and cleaning;Milk Storage Initiated by:: RN   Consult Status Consult Status: Follow-up Date: 08/06/19 Follow-up type: In-patient    Danelle Earthly 08/05/2019, 8:48 PM

## 2019-08-05 NOTE — H&P (Signed)
Angela Cunningham is a 24 y.o. female G2P1001 [redacted]w[redacted]d presenting to MAU for labor. She reports no LOF, VB. Reports regular contractions. Normal FM.   Pregnancy complicated by 1. Obesity: BMI 33 first trimester, now 37 2. Migraines - controlled with tylenol  3. Anxiety: on no meds 4. Short interval pregnancy: TSVD 04/05/2018. 5. Former Smoker   OB History    Gravida  2   Para  1   Term  1   Preterm      AB      Living  1     SAB      TAB      Ectopic      Multiple  0   Live Births  1          Past Medical History:  Diagnosis Date  . Anxiety   . Migraine   . Smoker   . UTI (urinary tract infection)    Past Surgical History:  Procedure Laterality Date  . NO PAST SURGERIES    . WISDOM TOOTH EXTRACTION     Family History: family history includes Migraines in her brother, father, and mother. Social History:  reports that she has quit smoking. She smoked 0.25 packs per day. She has never used smokeless tobacco. She reports previous drug use. Drug: Marijuana. She reports that she does not drink alcohol.     Maternal Diabetes: No Genetic Screening: Declined Maternal Ultrasounds/Referrals: Normal. Last [place] 6/17 due to measuring S<d, EFW 3676 gm (8#2) >90%, AC 97%. AFI 17.5. Posterior placenta  Fetal Ultrasounds or other Referrals:  None Maternal Substance Abuse:  No Significant Maternal Medications:  None Significant Maternal Lab Results:  Group B Strep negative Other Comments:  None  Review of Systems Per HPI Exam Physical Exam  Dilation: 5.5 Effacement (%): 70 Station: -2 Exam by:: Angela Cunningham  Blood pressure 119/74, pulse 95, temperature 98.1 F (36.7 C), resp. rate 18, height 5\' 3"  (1.6 m), weight 92.1 kg, SpO2 95 %, unknown if currently breastfeeding. NAD, resting comfortably but painful with contractions Gravid abdomen Fetal testing: FHR 140, 10x10, moderate variability, no decels. Cat 1. Toco q44m Prenatal labs: ABO, Rh:  --/--/PENDING (06/27  0940) Antibody: PENDING (06/27 0940) Rubella: Immune (11/24 0000) RPR: Nonreactive (04/06 0000)  HBsAg: Negative (11/24 0000)  HIV: Non-reactive (11/24 0000)  GBS: Negative/-- (06/01 0000)   Assessment/Plan: Angela Cunningham 23 y.o. G2P1001 at [redacted]w[redacted]d here with term labor 1. Labor on admit: SVE 5.5/70/-2, augment with AROM/pitocin prn. Desires epidural. 2. Obesity: BMI 37 3. Migraines - controlled with tylenol  4. Anxiety: on no meds 5. Rh neg: s/p rhogam 05/15/2019 (28w)  Angela Cunningham 08/05/2019, 10:24 AM

## 2019-08-05 NOTE — Anesthesia Procedure Notes (Signed)
Epidural Patient location during procedure: OB Start time: 08/05/2019 11:54 AM End time: 08/05/2019 12:06 PM  Staffing Anesthesiologist: Lowella Curb, MD Performed: anesthesiologist   Preanesthetic Checklist Completed: patient identified, IV checked, site marked, risks and benefits discussed, surgical consent, monitors and equipment checked, pre-op evaluation and timeout performed  Epidural Patient position: sitting Prep: ChloraPrep Patient monitoring: heart rate, cardiac monitor, continuous pulse ox and blood pressure Approach: midline Location: L2-L3 Injection technique: LOR saline  Needle:  Needle type: Tuohy  Needle gauge: 17 G Needle length: 9 cm Needle insertion depth: 6 cm Catheter type: closed end flexible Catheter size: 20 Guage Catheter at skin depth: 10 cm Test dose: negative  Assessment Events: blood not aspirated, injection not painful, no injection resistance, no paresthesia and negative IV test  Additional Notes Reason for block:procedure for pain

## 2019-08-05 NOTE — MAU Note (Signed)
Pt reports to mau with c/o ctx q 4 minutes since last night.  Denies LOF but reports some bloody show.  Pt reports good fetal movement.

## 2019-08-05 NOTE — Anesthesia Preprocedure Evaluation (Signed)
Anesthesia Evaluation  Patient identified by MRN, date of birth, ID band Patient awake    Reviewed: Allergy & Precautions, NPO status , Patient's Chart, lab work & pertinent test results  History of Anesthesia Complications Negative for: history of anesthetic complications  Airway Mallampati: II  TM Distance: >3 FB Neck ROM: Full   Comment: Tongue pierced Dental  (+) Dental Advisory Given   Pulmonary former smoker,    breath sounds clear to auscultation       Cardiovascular negative cardio ROS   Rhythm:Regular Rate:Normal     Neuro/Psych  Headaches,    GI/Hepatic Neg liver ROS, GERD  ,  Endo/Other  negative endocrine ROS  Renal/GU negative Renal ROS     Musculoskeletal   Abdominal (+) + obese,   Peds  Hematology plt 183k   Anesthesia Other Findings   Reproductive/Obstetrics (+) Pregnancy                             Anesthesia Physical  Anesthesia Plan  ASA: II  Anesthesia Plan: Epidural   Post-op Pain Management:    Induction:   PONV Risk Score and Plan: 1 and Treatment may vary due to age or medical condition  Airway Management Planned: Natural Airway  Additional Equipment:   Intra-op Plan:   Post-operative Plan:   Informed Consent: I have reviewed the patients History and Physical, chart, labs and discussed the procedure including the risks, benefits and alternatives for the proposed anesthesia with the patient or authorized representative who has indicated his/her understanding and acceptance.     Dental advisory given  Plan Discussed with:   Anesthesia Plan Comments: (Patient identified. Risks/Benefits/Options discussed with patient including but not limited to bleeding, infection, nerve damage, paralysis, failed block, incomplete pain control, headache, blood pressure changes, nausea, vomiting, reactions to medication both or allergic, itching and postpartum back  pain. Confirmed with bedside nurse the patient's most recent platelet count. Confirmed with patient that they are not currently taking any anticoagulation, have any bleeding history or any family history of bleeding disorders. Patient expressed understanding and wished to proceed. All questions were answered. )        Anesthesia Quick Evaluation

## 2019-08-06 LAB — CBC
HCT: 31.3 % — ABNORMAL LOW (ref 36.0–46.0)
Hemoglobin: 10.6 g/dL — ABNORMAL LOW (ref 12.0–15.0)
MCH: 32.7 pg (ref 26.0–34.0)
MCHC: 33.9 g/dL (ref 30.0–36.0)
MCV: 96.6 fL (ref 80.0–100.0)
Platelets: 149 10*3/uL — ABNORMAL LOW (ref 150–400)
RBC: 3.24 MIL/uL — ABNORMAL LOW (ref 3.87–5.11)
RDW: 13.6 % (ref 11.5–15.5)
WBC: 14.2 10*3/uL — ABNORMAL HIGH (ref 4.0–10.5)
nRBC: 0 % (ref 0.0–0.2)

## 2019-08-06 MED ORDER — IBUPROFEN 600 MG PO TABS: 600.0000 mg | ORAL_TABLET | Freq: Four times a day (QID) | ORAL | 0 refills | Status: AC

## 2019-08-06 NOTE — Progress Notes (Signed)
CSW received consult for hx of Anxiety and Depression.  CSW met with MOB to offer support and complete assessment.    CSW congratulated MOB and FIB on the birth of infant. CSW advised MOB of CSW's role and the reason for CSW coming to visit with her. MOB reported that she doesn't have diagnosis of anxiety, but does report anxiety at time. MOB denies having anxiety during her pregnancy but does report "it was long". CSW validated MOB's feeling of long pregnancy and offered MOB resource for anxiety. MOB reported no desire for medication management or for therapy at this time. CSW understanding and was informed that MOB isn't feeling SI or HI.   MOB denies having any other mental health hx and reported that her supports are her grandmother and FOB. MOB reported that she has all needed items to care for infant with no other needs at this time.   CSW provided education regarding the baby blues period vs. perinatal mood disorders, discussed treatment and gave resources for mental health follow up if concerns arise.  CSW recommends self-evaluation during the postpartum time period using the New Mom Checklist from Postpartum Progress and encouraged MOB to contact a medical professional if symptoms are noted at any time.   CSW provided review of Sudden Infant Death Syndrome (SIDS) precautions.     CSW identifies no further need for intervention and no barriers to discharge at this time.   Angela Cunningham, MSW, LCSW [facility] [phone]

## 2019-08-06 NOTE — Anesthesia Postprocedure Evaluation (Signed)
Anesthesia Post Note  Patient: Angela Cunningham  Procedure(s) Performed: AN AD HOC LABOR EPIDURAL     Patient location during evaluation: Mother Baby Anesthesia Type: Epidural Level of consciousness: awake and alert Pain management: pain level controlled Vital Signs Assessment: post-procedure vital signs reviewed and stable Respiratory status: spontaneous breathing, nonlabored ventilation and respiratory function stable Cardiovascular status: stable Postop Assessment: no headache, no backache and epidural receding Anesthetic complications: no   No complications documented.  Last Vitals:  Vitals:   08/05/19 2219 08/06/19 0259  BP: 98/64 102/67  Pulse: 86 94  Resp: 16 16  Temp: 36.8 C 36.9 C  SpO2: 100% 97%    Last Pain:  Vitals:   08/06/19 0259  TempSrc: Oral  PainSc:    Pain Goal: Patients Stated Pain Goal: 0 (08/05/19 0913)                 Fanny Dance

## 2019-08-06 NOTE — Lactation Note (Signed)
This note was copied from a baby's chart. Lactation Consultation Note  Patient Name: Angela Cunningham [id number] Date: 08/06/2019 Reason for consult: Follow-up assessment  P2 mother whose infant is now 40 hours old.  This is a term baby at 39+6 weeks.  Mother breast fed her first child (now 25 months old) for one month.  Baby was in the nursery getting a circumcision when I arrived.  Mother had no questions/concerns related to breast feeding.  She is enjoying the football hold and can successfully latch him without assistance.  Mother was happy to report that she could not do this with her first baby but feels more confident with this hold now.  Encouraged to continue feeding 8-12 times/24 hours or sooner if baby shows cues.  She will hand express and feed back any EBM she may obtain to baby.    Discussed how this morning's circumcision may hinder breast feeding throughout the day and baby may desire to feed more often in the night.  Mother verbalized understanding.  She has a DEBP for home use.  Father present.   Maternal Data    Feeding Feeding Type: Breast Fed  LATCH Score Latch: Grasps breast easily, tongue down, lips flanged, rhythmical sucking.  Audible Swallowing: A few with stimulation  Type of Nipple: Everted at rest and after stimulation  Comfort (Breast/Nipple): Soft / non-tender  Hold (Positioning): No assistance needed to correctly position infant at breast.  LATCH Score: 9  Interventions    Lactation Tools Discussed/Used     Consult Status Consult Status: Follow-up Date: 08/07/19 Follow-up type: In-patient    Ziare Orrick R Gracin Mcpartland 08/06/2019, 11:25 AM

## 2019-08-06 NOTE — Progress Notes (Signed)
PPD#1 Pt without complaints. Lochia mild. Breastfeeding going well.  VSSAF IMP/ Doing well. Plan/ Will discharge to home

## 2019-08-06 NOTE — Discharge Summary (Signed)
     Postpartum Discharge Summary       Patient Name: Angela Cunningham DOB: [birth year] MRN: [id number]  Date of admission: 08/05/2019 Delivery date:08/05/2019  Delivering provider: Janey Greaser K  Date of discharge: 08/06/2019  Admitting diagnosis: Normal labor [O80, Z37.9] Intrauterine pregnancy: [redacted]w[redacted]d     Secondary diagnosis:  Active Problems:   Normal labor    Discharge diagnosis: Term Pregnancy Delivered                                               Hospital course: Onset of Labor With Vaginal Delivery      24 y.o. yo [id number] at [redacted]w[redacted]d was admitted in Active Labor on 08/05/2019. Patient had an uncomplicated labor course as follows:  Membrane Rupture Time/Date: 1:00 PM ,08/05/2019   Delivery Method:Vaginal, Spontaneous  Episiotomy: None  Lacerations:  1st degree  Patient had an uncomplicated postpartum course.  She is ambulating, tolerating a regular diet, passing flatus, and urinating well. Patient is discharged home in stable condition on 08/06/19.  Newborn Data: Birth date:08/05/2019  Birth time:3:21 PM  Gender:Female  Living status:Living  Apgars:8 ,9  [id number] g    Physical exam  Vitals:   08/05/19 1732 08/05/19 1823 08/05/19 2219 08/06/19 0259  BP: 115/69 110/71 98/64 102/67  Pulse: 86 74 86 94  Resp: 18 18 16 16   Temp: 98.3 F (36.8 C) 98.1 F (36.7 C) 98.2 F (36.8 C) 98.4 F (36.9 C)  TempSrc: Oral Oral Oral Oral  SpO2: 99% 99% 100% 97%  Weight:      Height:       General: alert Lochia: appropriate  Lab Results  Component Value Date   WBC 14.2 (H) 08/06/2019   HGB 10.6 (L) 08/06/2019   HCT 31.3 (L) 08/06/2019   MCV 96.6 08/06/2019   PLT 149 (L) 08/06/2019      Discharge home in stable condition Infant Feeding: Breast Infant Disposition:home with mother Discharge instruction: per After Visit Summary and Postpartum booklet. Activity: Advance as tolerated. Pelvic rest for 6 weeks.  Diet: routine diet Follow up Visit:  Follow-up  Information    Taam-Akelman, 08/08/2019, MD. Schedule an appointment as soon as possible for a visit in 1 month(s).   Specialty: Obstetrics and Gynecology Contact information: [address] [place] [phone]                   08/06/2019 08/08/2019, MD

## 2022-09-28 ENCOUNTER — Encounter (HOSPITAL_COMMUNITY): Payer: Self-pay

## 2022-09-28 ENCOUNTER — Emergency Department (HOSPITAL_COMMUNITY)
Admission: EM | Admit: 2022-09-28 | Discharge: 2022-09-29 | Discharge status: Against Medical Advice | Payer: Medicaid Other | Attending: Emergency Medicine | Admitting: Emergency Medicine

## 2022-09-28 ENCOUNTER — Ambulatory Visit
Admission: EM | Admit: 2022-09-28 | Discharge: 2022-09-28 | Disposition: A | Discharge status: Home / Self Care | Payer: Medicaid Other | Attending: Internal Medicine | Admitting: Internal Medicine

## 2022-09-28 ENCOUNTER — Other Ambulatory Visit: Payer: Self-pay

## 2022-09-28 DIAGNOSIS — M542 Cervicalgia: Secondary | ICD-10-CM | POA: Diagnosis not present

## 2022-09-28 DIAGNOSIS — Z3202 Encounter for pregnancy test, result negative: Secondary | ICD-10-CM

## 2022-09-28 DIAGNOSIS — R519 Headache, unspecified: Secondary | ICD-10-CM | POA: Insufficient documentation

## 2022-09-28 DIAGNOSIS — Z5321 Procedure and treatment not carried out due to patient leaving prior to being seen by health care provider: Secondary | ICD-10-CM | POA: Insufficient documentation

## 2022-09-28 DIAGNOSIS — R110 Nausea: Secondary | ICD-10-CM | POA: Insufficient documentation

## 2022-09-28 DIAGNOSIS — R112 Nausea with vomiting, unspecified: Secondary | ICD-10-CM | POA: Diagnosis not present

## 2022-09-28 LAB — POCT URINE PREGNANCY: Preg Test, Ur: NEGATIVE

## 2022-09-28 NOTE — ED Provider Notes (Signed)
[facility]    CSN: [id number] Arrival date & time: 09/28/22  1455      History   Chief Complaint Chief Complaint  Patient presents with   Neck Pain    HPI Angela Cunningham is a 27 y.o. female.   Patient presents with nausea, vomiting, headache, neck pain.  Reports that headache and neck pain started a few days ago but vomiting started today.  Reports that she does have history of migraines but this does not feel similar.  Patient rates pain 8/10 on pain scale and states it is constant.  Neck pain starts in the right lateral neck and radiates up to the occipital portion of the head and to the top of the head.  Denies any recent falls or injuries to the area.  Denies any obvious fever or upper respiratory symptoms.  Reports that she has been having difficulty keeping food and fluids down today.  Denies any associated diarrhea and is having normal bowel movements.  Denies any obvious urinary symptoms and patient is not sure of last menstrual cycle as she receives Depo injection.  Patient mentions recent boil on back that was present about 1 to 2 weeks ago.  States it is healed over as she took another family member's doxycycline antibiotic.  She mentions it because she is not sure if it is related.  Patient was pulled back from the lobby given reports that she was violently vomiting.   Neck Pain   Past Medical History:  Diagnosis Date   Anxiety    H/O wisdom tooth extraction    Migraine    Smoker    UTI (urinary tract infection)     Patient Active Problem List   Diagnosis Date Noted   Pregnancy 04/06/2018   Normal labor 04/05/2018   Common migraine with intractable migraine 11/04/2017    Past Surgical History:  Procedure Laterality Date   NO PAST SURGERIES     WISDOM TOOTH EXTRACTION      OB History     Gravida  2   Para  2   Term  2   Preterm      AB      Living  2      SAB      IAB      Ectopic      Multiple  0   Live Births  2             Home Medications    Prior to Admission medications   Medication Sig Start Date End Date Taking? Authorizing Provider  ibuprofen (ADVIL) 600 MG tablet Take 1 tablet (600 mg total) by mouth every 6 (six) hours. 08/06/19   Levi Aland, MD  Prenatal Vit-Fe Fumarate-FA (PRENATAL COMPLETE) 14-0.4 MG TABS Take 1 tablet by mouth daily. 08/20/17   Georgetta Haber, NP    Family History Family History  Problem Relation Age of Onset   Migraines Mother    Migraines Father    Migraines Brother     Social History Social History   Tobacco Use   Smoking status: Former    Current packs/day: 0.25    Types: Cigarettes   Smokeless tobacco: Never  Vaping Use   Vaping status: Never Used  Substance Use Topics   Alcohol use: No   Drug use: Not Currently    Types: Marijuana    Comment: Stopped in early pregnancy     Allergies   Patient has no known allergies.  Review of Systems Review of Systems Per HPI  Physical Exam Triage Vital Signs ED Triage Vitals  Encounter Vitals Group     BP 09/28/22 1539 126/83     Systolic BP Percentile --      Diastolic BP Percentile --      Pulse Rate 09/28/22 1539 79     Resp 09/28/22 1539 18     Temp 09/28/22 1539 98.2 F (36.8 C)     Temp Source 09/28/22 1539 Oral     SpO2 09/28/22 1539 98 %     Weight --      Height --      Head Circumference --      Peak Flow --      Pain Score 09/28/22 1537 8     Pain Loc --      Pain Education --      Exclude from Growth Chart --    No data found.  Updated Vital Signs BP 126/83 (BP Location: Left Arm)   Pulse 79   Temp 98.2 F (36.8 C) (Oral)   Resp 18   SpO2 98%   Breastfeeding No   Visual Acuity Right Eye Distance:   Left Eye Distance:   Bilateral Distance:    Right Eye Near:   Left Eye Near:    Bilateral Near:     Physical Exam Constitutional:      General: She is not in acute distress.    Appearance: Normal appearance. She is not toxic-appearing or diaphoretic.   HENT:     Head: Normocephalic and atraumatic.  Eyes:     Extraocular Movements: Extraocular movements intact.     Conjunctiva/sclera: Conjunctivae normal.     Pupils: Pupils are equal, round, and reactive to light.  Neck:     Comments: No obvious tenderness to palpation to neck. Full ROM of neck present.  Cardiovascular:     Rate and Rhythm: Normal rate and regular rhythm.     Pulses: Normal pulses.     Heart sounds: Normal heart sounds.  Pulmonary:     Effort: Pulmonary effort is normal. No respiratory distress.     Breath sounds: Normal breath sounds.  Skin:         Comments: Patient has healed over previous abscess that is flat with no obvious signs of infection.  Neurological:     General: No focal deficit present.     Mental Status: She is alert and oriented to person, place, and time. Mental status is at baseline.     Cranial Nerves: Cranial nerves 2-12 are intact.     Sensory: Sensation is intact.     Motor: Motor function is intact.     Coordination: Coordination is intact.     Gait: Gait is intact.  Psychiatric:        Mood and Affect: Mood normal.        Behavior: Behavior normal.        Thought Content: Thought content normal.        Judgment: Judgment normal.      UC Treatments / Results  Labs (all labs ordered are listed, but only abnormal results are displayed) Labs Reviewed  POCT URINE PREGNANCY    EKG   Radiology No results found.  Procedures Procedures (including critical care time)  Medications Ordered in UC Medications - No data to display  Initial Impression / Assessment and Plan / UC Course  I have reviewed the triage vital signs and the nursing notes.  Pertinent labs & imaging results that were available during my care of the patient were reviewed by me and considered in my medical decision making (see chart for details).     Urine pregnancy test was negative.  Given patient's current symptoms, discussed with patient that I do have  a concern for meningitis and need for further evaluation of this.  Patient reports that she does have migraines but this does not feel similar which is concerning.  Therefore, patient was advised to go to the emergency department today to get a more extensive evaluation and she was agreeable with this plan.  Abscess appears healed with no further recommendations or treatment needed and do not think it is related to patient's current symptoms.  Vital signs stable at discharge.  Agree with patient's parent transporting her to the ER. Final Clinical Impressions(s) / UC Diagnoses   Final diagnoses:  Nausea and vomiting, unspecified vomiting type  Acute nonintractable headache, unspecified headache type  Neck pain  Urine pregnancy test negative     Discharge Instructions      Your pregnancy test was negative.  Please go straight to the emergency department as soon as you leave urgent care for further evaluation and management.    ED Prescriptions   None    PDMP not reviewed this encounter.   Gustavus Bryant, [place] 09/28/22 [phone]

## 2022-09-28 NOTE — ED Triage Notes (Signed)
Pt is coming for urgent care in which she went for upper shoulder pain/neck pain that leads to a headache. She states that she went to UC who sent her here because they were afraid she has meningitis. Pt states she states he head pain has been increased today, with some dizziness and nausea. No fevers at home, Pt does not exhibit high levels of lethargy but has been more tired recently. She believes most of this pain is attributed to muscle pain because that is usually what is the cause.

## 2022-09-28 NOTE — Discharge Instructions (Signed)
Your pregnancy test was negative.  Please go straight to the emergency department as soon as you leave urgent care for further evaluation and management.

## 2022-09-28 NOTE — ED Notes (Addendum)
Patient is being discharged from the Urgent Care and sent to the Emergency Department via POV . Per Ervin Knack, FNP, patient is in need of higher level of care due to possible meningitis. Patient is aware and verbalizes understanding of plan of care. Family member present to drive. Vitals:   09/28/22 1539  BP: 126/83  Pulse: 79  Resp: 18  Temp: 98.2 F (36.8 C)  SpO2: 98%

## 2022-09-28 NOTE — ED Triage Notes (Signed)
Pt reports headache , neck pain, emesis x 6 today. Denies sick contacts. States she had a boil on her back in the last 2 weeks that drained on its own and has healed over. ? If this is r/t her symptoms today. Alert

## 2022-09-28 NOTE — ED Notes (Signed)
Room call x1

## 2022-09-28 NOTE — ED Provider Triage Note (Signed)
Emergency Medicine Provider Triage Evaluation Note  Angela Cunningham , a 27 y.o. female  was evaluated in triage.  Pt complains of headache for the past 4 days, she has taken medication with some improvement in her symptoms.  Underlying history of chronic migraines, they have been improving over the last couple of days, however she was evaluated urgent care today and sent here to rule out meningitis.  Patient has a good full range of motion of her neck, has not had any fevers, no vision changes.  This feels like her prior headaches.  Review of Systems  Positive: Headaches, nausea Negative: Fever, vomiting  Physical Exam  BP 106/65 (BP Location: Right Arm)   Pulse 90   Temp 97.9 F (36.6 C) (Oral)   Resp 17   SpO2 98%  Gen:   Awake, no distress   Resp:  Normal effort  MSK:   Moves extremities without difficulty  Other:  Reassuring neurological exam without any focal weakness.  Medical Decision Making  Medically screening exam initiated at 4:43 PM.  Appropriate orders placed.  Angela Cunningham was informed that the remainder of the evaluation will be completed by another provider, this initial triage assessment does not replace that evaluation, and the importance of remaining in the ED until their evaluation is complete.  Negative pregnancy at [facility]. Likely needs Headache cocktail    Claude Manges, PA-C 09/28/22 1648
